# Patient Record
Sex: Female | Born: 1945 | Race: White | Hispanic: No | Marital: Married | Smoking: Never smoker
Health system: Southern US, Community
[De-identification: ages and names within clinical notes are randomized; demographics above are authoritative.]

## PROBLEM LIST (undated history)

## (undated) DIAGNOSIS — Z9221 Personal history of antineoplastic chemotherapy: Secondary | ICD-10-CM

## (undated) DIAGNOSIS — C801 Malignant (primary) neoplasm, unspecified: Secondary | ICD-10-CM

## (undated) DIAGNOSIS — N189 Chronic kidney disease, unspecified: Secondary | ICD-10-CM

## (undated) DIAGNOSIS — C50919 Malignant neoplasm of unspecified site of unspecified female breast: Secondary | ICD-10-CM

## (undated) DIAGNOSIS — Z85528 Personal history of other malignant neoplasm of kidney: Secondary | ICD-10-CM

## (undated) DIAGNOSIS — J45909 Unspecified asthma, uncomplicated: Secondary | ICD-10-CM

## (undated) DIAGNOSIS — Z923 Personal history of irradiation: Secondary | ICD-10-CM

## (undated) HISTORY — DX: Chronic kidney disease, unspecified: N18.9

## (undated) HISTORY — PX: BREAST BIOPSY: SHX20

## (undated) HISTORY — DX: Personal history of other malignant neoplasm of kidney: Z85.528

## (undated) HISTORY — PX: BREAST EXCISIONAL BIOPSY: SUR124

## (undated) HISTORY — PX: KIDNEY SURGERY: SHX687

## (undated) HISTORY — DX: Unspecified asthma, uncomplicated: J45.909

---

## 1992-02-23 DIAGNOSIS — Z9221 Personal history of antineoplastic chemotherapy: Secondary | ICD-10-CM

## 1992-02-23 DIAGNOSIS — Z923 Personal history of irradiation: Secondary | ICD-10-CM

## 1992-02-23 HISTORY — DX: Personal history of antineoplastic chemotherapy: Z92.21

## 1992-02-23 HISTORY — PX: BREAST LUMPECTOMY: SHX2

## 1992-02-23 HISTORY — DX: Personal history of irradiation: Z92.3

## 1992-02-23 HISTORY — PX: BREAST BIOPSY: SHX20

## 1996-02-23 HISTORY — PX: BREAST BIOPSY: SHX20

## 1996-02-23 HISTORY — PX: BREAST LUMPECTOMY: SHX2

## 2007-04-03 DIAGNOSIS — J45909 Unspecified asthma, uncomplicated: Secondary | ICD-10-CM | POA: Insufficient documentation

## 2009-05-20 DIAGNOSIS — J309 Allergic rhinitis, unspecified: Secondary | ICD-10-CM | POA: Insufficient documentation

## 2009-08-19 ENCOUNTER — Ambulatory Visit: Payer: Self-pay | Admitting: Internal Medicine

## 2011-02-11 ENCOUNTER — Ambulatory Visit: Payer: Self-pay | Admitting: Internal Medicine

## 2011-10-20 ENCOUNTER — Ambulatory Visit: Payer: Self-pay

## 2012-02-14 ENCOUNTER — Ambulatory Visit: Payer: Self-pay | Admitting: Internal Medicine

## 2013-02-19 ENCOUNTER — Ambulatory Visit: Payer: Self-pay | Admitting: Internal Medicine

## 2013-05-08 DIAGNOSIS — Z961 Presence of intraocular lens: Secondary | ICD-10-CM | POA: Insufficient documentation

## 2013-05-08 DIAGNOSIS — H0230 Blepharochalasis unspecified eye, unspecified eyelid: Secondary | ICD-10-CM | POA: Insufficient documentation

## 2013-05-08 DIAGNOSIS — H40009 Preglaucoma, unspecified, unspecified eye: Secondary | ICD-10-CM | POA: Insufficient documentation

## 2013-07-12 DIAGNOSIS — Z905 Acquired absence of kidney: Secondary | ICD-10-CM | POA: Insufficient documentation

## 2014-02-25 ENCOUNTER — Ambulatory Visit: Payer: Self-pay | Admitting: Internal Medicine

## 2015-05-01 DIAGNOSIS — Z85528 Personal history of other malignant neoplasm of kidney: Secondary | ICD-10-CM | POA: Insufficient documentation

## 2015-05-02 ENCOUNTER — Other Ambulatory Visit: Payer: Self-pay | Admitting: Internal Medicine

## 2015-05-02 DIAGNOSIS — Z1231 Encounter for screening mammogram for malignant neoplasm of breast: Secondary | ICD-10-CM

## 2015-05-12 DIAGNOSIS — Z85528 Personal history of other malignant neoplasm of kidney: Secondary | ICD-10-CM | POA: Insufficient documentation

## 2015-05-13 ENCOUNTER — Ambulatory Visit: Payer: Self-pay

## 2015-05-16 ENCOUNTER — Other Ambulatory Visit: Payer: Self-pay | Admitting: Internal Medicine

## 2015-05-16 ENCOUNTER — Ambulatory Visit
Admission: RE | Admit: 2015-05-16 | Discharge: 2015-05-16 | Disposition: A | Discharge status: Home / Self Care | Payer: Medicare Other | Source: Ambulatory Visit | Attending: Internal Medicine | Admitting: Internal Medicine

## 2015-05-16 DIAGNOSIS — Z1231 Encounter for screening mammogram for malignant neoplasm of breast: Secondary | ICD-10-CM | POA: Diagnosis present

## 2015-05-16 HISTORY — DX: Malignant neoplasm of unspecified site of unspecified female breast: C50.919

## 2015-05-16 HISTORY — DX: Malignant (primary) neoplasm, unspecified: C80.1

## 2015-11-10 DIAGNOSIS — N1831 Chronic kidney disease, stage 3a: Secondary | ICD-10-CM | POA: Insufficient documentation

## 2016-05-10 ENCOUNTER — Other Ambulatory Visit: Payer: Self-pay | Admitting: Internal Medicine

## 2016-05-10 DIAGNOSIS — Z1231 Encounter for screening mammogram for malignant neoplasm of breast: Secondary | ICD-10-CM

## 2016-05-10 DIAGNOSIS — I700 Atherosclerosis of aorta: Secondary | ICD-10-CM | POA: Insufficient documentation

## 2016-06-08 ENCOUNTER — Ambulatory Visit
Admission: RE | Admit: 2016-06-08 | Discharge: 2016-06-08 | Disposition: A | Discharge status: Home / Self Care | Payer: Medicare Other | Source: Ambulatory Visit | Attending: Internal Medicine | Admitting: Internal Medicine

## 2016-06-08 DIAGNOSIS — Z1231 Encounter for screening mammogram for malignant neoplasm of breast: Secondary | ICD-10-CM | POA: Insufficient documentation

## 2017-05-24 ENCOUNTER — Other Ambulatory Visit: Payer: Self-pay | Admitting: Internal Medicine

## 2017-05-24 DIAGNOSIS — Z1231 Encounter for screening mammogram for malignant neoplasm of breast: Secondary | ICD-10-CM

## 2017-06-21 ENCOUNTER — Ambulatory Visit
Admission: RE | Admit: 2017-06-21 | Discharge: 2017-06-21 | Disposition: A | Discharge status: Home / Self Care | Payer: Medicare Other | Source: Ambulatory Visit | Attending: Internal Medicine | Admitting: Internal Medicine

## 2017-06-21 DIAGNOSIS — Z1231 Encounter for screening mammogram for malignant neoplasm of breast: Secondary | ICD-10-CM | POA: Diagnosis present

## 2017-06-21 HISTORY — DX: Personal history of antineoplastic chemotherapy: Z92.21

## 2017-06-21 HISTORY — DX: Personal history of irradiation: Z92.3

## 2017-11-27 DIAGNOSIS — M8589 Other specified disorders of bone density and structure, multiple sites: Secondary | ICD-10-CM | POA: Insufficient documentation

## 2017-12-07 DIAGNOSIS — I071 Rheumatic tricuspid insufficiency: Secondary | ICD-10-CM | POA: Insufficient documentation

## 2018-08-01 ENCOUNTER — Other Ambulatory Visit: Payer: Self-pay | Admitting: Internal Medicine

## 2018-08-01 DIAGNOSIS — Z1231 Encounter for screening mammogram for malignant neoplasm of breast: Secondary | ICD-10-CM

## 2018-08-11 ENCOUNTER — Ambulatory Visit
Admission: RE | Admit: 2018-08-11 | Discharge: 2018-08-11 | Disposition: A | Discharge status: Home / Self Care | Payer: Medicare Other | Source: Ambulatory Visit | Attending: Internal Medicine | Admitting: Internal Medicine

## 2018-08-11 ENCOUNTER — Other Ambulatory Visit: Payer: Self-pay

## 2018-08-11 DIAGNOSIS — Z1231 Encounter for screening mammogram for malignant neoplasm of breast: Secondary | ICD-10-CM | POA: Diagnosis not present

## 2019-07-17 ENCOUNTER — Other Ambulatory Visit: Payer: Self-pay | Admitting: Internal Medicine

## 2019-07-17 DIAGNOSIS — Z1231 Encounter for screening mammogram for malignant neoplasm of breast: Secondary | ICD-10-CM

## 2019-08-30 ENCOUNTER — Ambulatory Visit
Admission: RE | Admit: 2019-08-30 | Discharge: 2019-08-30 | Disposition: A | Discharge status: Home / Self Care | Payer: Medicare PPO | Source: Ambulatory Visit | Attending: Internal Medicine | Admitting: Internal Medicine

## 2019-08-30 DIAGNOSIS — Z1231 Encounter for screening mammogram for malignant neoplasm of breast: Secondary | ICD-10-CM | POA: Insufficient documentation

## 2020-01-30 ENCOUNTER — Other Ambulatory Visit: Payer: Self-pay

## 2020-01-30 ENCOUNTER — Ambulatory Visit
Admission: RE | Admit: 2020-01-30 | Discharge: 2020-01-30 | Disposition: A | Discharge status: Home / Self Care | Payer: Medicare PPO | Source: Ambulatory Visit | Attending: Pulmonary Disease | Admitting: Pulmonary Disease

## 2020-01-30 ENCOUNTER — Encounter: Payer: Self-pay | Admitting: Pulmonary Disease

## 2020-01-30 ENCOUNTER — Ambulatory Visit: Payer: Medicare PPO | Admitting: Pulmonary Disease

## 2020-01-30 VITALS — BP 126/72 | HR 87 | Temp 98.0°F | Ht 64.0 in | Wt 133.0 lb

## 2020-01-30 DIAGNOSIS — K219 Gastro-esophageal reflux disease without esophagitis: Secondary | ICD-10-CM

## 2020-01-30 DIAGNOSIS — R053 Chronic cough: Secondary | ICD-10-CM

## 2020-01-30 DIAGNOSIS — K8020 Calculus of gallbladder without cholecystitis without obstruction: Secondary | ICD-10-CM | POA: Insufficient documentation

## 2020-01-30 DIAGNOSIS — R0982 Postnasal drip: Secondary | ICD-10-CM

## 2020-01-30 DIAGNOSIS — E785 Hyperlipidemia, unspecified: Secondary | ICD-10-CM | POA: Insufficient documentation

## 2020-01-30 DIAGNOSIS — D1800 Hemangioma unspecified site: Secondary | ICD-10-CM | POA: Insufficient documentation

## 2020-01-30 MED ORDER — CETIRIZINE HCL 10 MG PO TABS: 5.0000 mg | ORAL_TABLET | Freq: Every day | ORAL | 0 refills | Status: AC

## 2020-01-30 NOTE — Progress Notes (Signed)
Subjective:    Patient ID: Alexandria Carter, female    DOB: 1945/10/25, 74 y.o.   MRN: [id number]  HPI Patient is a 74 year old lifelong never smoker who presents for evaluation of a cough of 13 years duration.  She is kindly referred by Dr. Ramonita Lab III.  As noted the patient has been lifelong never smoker however did get exposed significantly to secondhand smoke as a child.  States that she was diagnosed with breast cancer in 1994 and at that time underwent radiation therapy.  She noted cough after the radiation therapy and steroids at that time helped.  Her cough solved at that point but then subsequently around 2008 she developed problems with cough particularly in the mornings.  She notes postnasal drip and occasional nasal congestion but is afraid of using antihistamines due to potential risk for kidney dysfunction.  She has a history of renal cell cancer and has a solitary kidney.  She has been diagnosed with asthma in 2005 however uses no inhalers.  She has been diagnosed with GERD and is on omeprazole which she only takes 3 times a week.  She states however her symptoms are well controlled.  Previously her work-up of cough included bronchoscopy in 2010 by Dr. Shanon Brow DeFeo in [place].  That bronchoscopy only showed some mild erythema of the airways but no structural lesions.  Cultures were negative.  I have reviewed those records personally, available through [facility].  She is not on ACE inhibitors.  She has not noticed any fevers, chills or sweats.  Her cough is usually worse in the mornings when her postnasal drip is worse just immediately upon arising.  She has had no sputum production no hemoptysis.  She has not had any orthopnea, paroxysmal nocturnal dyspnea or lower extremity edema.  Her cough is only alleviated partly by drinking water or by using cough drops.   Review of Systems A 10 point review of systems was performed and it is as noted above otherwise negative.  Past  Medical History:  Diagnosis Date  . Asthma   . Breast cancer ([place]) 1994 and 1998   Bilateral  . Cancer ([place]) 9735,3299   Bil breast ca- chemo/radiation  . Cancer ([place])    Kidney  . Chronic kidney disease   . History of kidney cancer   . Personal history of chemotherapy 1994   F/U left breast cancer  . Personal history of radiation therapy 1994   F/U left breast cancer  . Personal history of radiation therapy 1998   F/U right breast cancer   Patient Active Problem List   Diagnosis Date Noted  . Gallstones 01/30/2020  . GERD (gastroesophageal reflux disease) 01/30/2020  . Hemangioma 01/30/2020  . Hyperlipidemia 01/30/2020  . Moderate tricuspid regurgitation 12/07/2017  . Osteopenia of multiple sites 11/27/2017  . Aortic atherosclerosis ([place]) 05/10/2016  . History of kidney cancer 05/12/2015  . H/O renal cell carcinoma 05/01/2015  . Solitary kidney, acquired 07/12/2013  . Glaucoma suspect 05/08/2013  . Pseudophakia of both eyes 05/08/2013  . Pseudoptosis 05/08/2013  . Allergic rhinitis 05/20/2009  . Asthma 04/03/2007   Past Surgical History:  Procedure Laterality Date  . BREAST BIOPSY Left 1994   +  . BREAST BIOPSY Right 1998   +  . BREAST BIOPSY Bilateral    neg  . BREAST LUMPECTOMY Left 1994   positive, F/U chemo and radiation  . BREAST LUMPECTOMY Right 1998   positive, F/U radiation   . KIDNEY SURGERY  Family History  Problem Relation Age of Onset  . Breast cancer Cousin   . Lung cancer Father   . Heart failure Father   . Lung cancer Sister    No Known Allergies  Current Meds  Medication Sig  . ascorbic acid (VITAMIN C) 500 MG tablet Take 500 mg by mouth daily.  Marland Kitchen aspirin 81 MG EC tablet Take 81 mg by mouth daily.  . Calcium Carbonate-Vitamin D (CALCIUM-VITAMIN D) 600-125 MG-UNIT TABS Take 1 tablet by mouth daily.  Marland Kitchen latanoprost (XALATAN) 0.005 % ophthalmic solution nightly  . lovastatin (MEVACOR) 40 MG tablet 80 mg daily.  Marland Kitchen omeprazole (PRILOSEC)  40 MG capsule Take 1 capsule by mouth daily.   Immunization History  Administered Date(s) Administered  . Fluad Quad(high Dose 65+) 11/02/2018, 11/19/2019  . Influenza Inj Mdck Quad Pf 11/16/2017  . Influenza, High Dose Seasonal PF 12/14/2016  . Influenza-Unspecified 11/23/2011, 12/25/2014, 11/10/2015  . Moderna SARS-COVID-2 Vaccination 03/23/2019, 04/20/2019  . Pneumococcal Conjugate-13 03/21/2015  . Pneumococcal Polysaccharide-23 10/08/2010  . Tdap 02/22/2005  . Zoster 04/22/2008  . Zoster Recombinat (Shingrix) 09/20/2018, 11/27/2018        Objective:   Physical Exam BP 126/72 (BP Location: Left Arm, Cuff Size: Normal)   Pulse 87   Temp 98 F (36.7 C) (Temporal)   Ht 5\' 4"  (1.626 m)   Wt 133 lb (60.3 kg)   SpO2 97%   BMI 22.83 kg/m  GENERAL: Well-developed well-nourished woman, no acute distress.  Slender build.  Fully ambulatory.  Does clear her throat frequently during the visit. HEAD: Normocephalic, atraumatic.  EYES: Pupils equal, round, reactive to light.  No scleral icterus.  MOUTH: Nose/mouth/throat not examined due to masking requirements for COVID 19. NECK: Supple. No thyromegaly. Trachea midline. No JVD.  No adenopathy. PULMONARY: Good air entry bilaterally.  No adventitious sounds. CARDIOVASCULAR: S1 and S2. Regular rate and rhythm.  No rubs, murmurs or gallops heard. ABDOMEN: Benign. MUSCULOSKELETAL: No joint deformity, no clubbing, no edema.  NEUROLOGIC: No focal deficits, no gait disturbance, speech is fluent. SKIN: Intact,warm,dry.  On limited exam, no rashes. PSYCH: Mood and behavior are normal.   Chest x-ray performed today, independently reviewed:      There appears to be apical pleural-parenchymal apical scarring on the left with mild hyperinflation, pleural-parenchymal scarring has been described on prior x-ray reports.     Assessment & Plan:     ICD-10-CM   1. Chronic cough  R05.3 Pulmonary Function Test [facility] Only    DG Chest 2 View    Suspect mostly of upper airway cough syndrome May have element of GERD/LPR Will obtain PFTs as does have history of asthma  2. Post-nasal drip  R09.82    Trial Zyrtec 5 mg at bedtime Nasal hygiene  3. Gastroesophageal reflux disease, unspecified whether esophagitis present  K21.9    Continue omeprazole Continue antireflux measures   Orders Placed This Encounter  Procedures  . DG Chest 2 View    Standing Status:   Future    Number of Occurrences:   1    Standing Expiration Date:   07/30/2020    Order Specific Question:   Reason for Exam (SYMPTOM  OR DIAGNOSIS REQUIRED)    Answer:   cough    Order Specific Question:   Preferred imaging location?    Answer:   [facility]  . Pulmonary Function Test [facility] Only    Standing Status:   Future    Standing Expiration Date:   01/29/2021  Scheduling Instructions:     Next available.    Order Specific Question:   Full PFT: includes the following: basic spirometry, spirometry pre & post bronchodilator, diffusion capacity (DLCO), lung volumes    Answer:   Full PFT   Meds ordered this encounter  Medications  . cetirizine (ZYRTEC ALLERGY) 10 MG tablet    Sig: Take 0.5 tablets (5 mg total) by mouth at bedtime.    Dispense:  30 tablet    Refill:  0   Discussion:  The patient has had history of coughing of longstanding.  Control the cough may be difficult as by this point there may be some degree of habituation.  It does appear however that she has significant postnasal drip issues which could be leading to upper airway cough syndrome.  In addition she does have issues with reflux which could lead to laryngopharyngeal reflux.  She was instructed to continue strict antireflux measures and take omeprazole as she is doing.  We will try Zyrtec 5 mg at bedtime to see if this helps with her nasal symptoms.  PFTs are indicated as the patient has had a history of asthma previously but currently on no inhalers.  We will determine whether she needs CT  chest after her PFTs if these show any diffusion capacity issues.  X-ray today was unremarkable compared to prior reports as we have no actual films.  We will see the patient in follow-up in 4 to 6 weeks time she is to contact us prior to that time should a new difficulties arise.   Renold Don, MD [place] [facility]   *This note was dictated using voice recognition software/Dragon.  Despite best efforts to proofread, errors can occur which can change the meaning.  Any change was purely unintentional.

## 2020-01-30 NOTE — Patient Instructions (Signed)
We are going to get a chest x-ray to initiate evaluation of your cough.  We will obtain breathing test to exclude potential asthma.  Start taking Zyrtec (cetirizine) over-the-counter, this is a 10 mg tablet, half tablet (5 mg) at bedtime.  We will see you in follow-up in 4 to 6 weeks time call sooner should any new problems arise.

## 2020-01-31 ENCOUNTER — Other Ambulatory Visit: Payer: Self-pay

## 2020-01-31 DIAGNOSIS — R059 Cough, unspecified: Secondary | ICD-10-CM

## 2020-02-01 ENCOUNTER — Other Ambulatory Visit
Admission: RE | Admit: 2020-02-01 | Discharge: 2020-02-01 | Disposition: A | Discharge status: Home / Self Care | Payer: Medicare PPO | Source: Ambulatory Visit | Attending: Pulmonary Disease | Admitting: Pulmonary Disease

## 2020-02-01 ENCOUNTER — Other Ambulatory Visit: Payer: Self-pay

## 2020-02-01 DIAGNOSIS — Z20822 Contact with and (suspected) exposure to covid-19: Secondary | ICD-10-CM | POA: Diagnosis not present

## 2020-02-01 DIAGNOSIS — Z01812 Encounter for preprocedural laboratory examination: Secondary | ICD-10-CM | POA: Insufficient documentation

## 2020-02-02 LAB — SARS CORONAVIRUS 2 (TAT 6-24 HRS): SARS Coronavirus 2: NEGATIVE

## 2020-02-04 ENCOUNTER — Other Ambulatory Visit: Payer: Self-pay

## 2020-02-04 ENCOUNTER — Ambulatory Visit: Payer: Medicare PPO | Attending: Pulmonary Disease

## 2020-02-04 DIAGNOSIS — R053 Chronic cough: Secondary | ICD-10-CM | POA: Diagnosis present

## 2020-02-04 MED ORDER — ALBUTEROL SULFATE (2.5 MG/3ML) 0.083% IN NEBU
2.5000 mg | INHALATION_SOLUTION | Freq: Once | RESPIRATORY_TRACT | Status: AC
  Administered 2020-02-04: 11:00:00 2.5 mg via RESPIRATORY_TRACT
  Filled 2020-02-04: qty 3

## 2020-02-05 LAB — PULMONARY FUNCTION TEST ARMC ONLY
DL/VA % pred: 84 %
DL/VA: 3.46 ml/min/mmHg/L
DLCO unc % pred: 77 %
DLCO unc: 14.82 ml/min/mmHg
FEF 25-75 Post: 1.07 L/sec
FEF 25-75 Pre: 0.69 L/sec
FEF2575-%Change-Post: 54 %
FEF2575-%Pred-Post: 64 %
FEF2575-%Pred-Pre: 41 %
FEV1-%Change-Post: 12 %
FEV1-%Pred-Post: 77 %
FEV1-%Pred-Pre: 69 %
FEV1-Post: 1.64 L
FEV1-Pre: 1.46 L
FEV1FVC-%Change-Post: 7 %
FEV1FVC-%Pred-Pre: 82 %
FEV6-%Change-Post: 5 %
FEV6-%Pred-Post: 91 %
FEV6-%Pred-Pre: 86 %
FEV6-Post: 2.44 L
FEV6-Pre: 2.32 L
FEV6FVC-%Change-Post: 1 %
FEV6FVC-%Pred-Post: 104 %
FEV6FVC-%Pred-Pre: 103 %
FVC-%Change-Post: 4 %
FVC-%Pred-Post: 87 %
FVC-%Pred-Pre: 83 %
FVC-Post: 2.46 L
Post FEV1/FVC ratio: 67 %
Post FEV6/FVC ratio: 99 %
Pre FEV1/FVC ratio: 62 %
Pre FEV6/FVC Ratio: 98 %
RV % pred: 113 %
RV: 2.61 L
TLC % pred: 97 %
TLC: 4.95 L

## 2020-02-06 ENCOUNTER — Other Ambulatory Visit: Payer: Self-pay

## 2020-02-06 MED ORDER — BREO ELLIPTA 200-25 MCG/INH IN AEPB: 1.0000 | INHALATION_SPRAY | Freq: Every day | RESPIRATORY_TRACT | 0 refills | Status: AC

## 2020-02-18 NOTE — Telephone Encounter (Signed)
Dr. Jayme Cloud please advise on patient mychart message  I am using the sample Breo inhaler.  There are only 6 more uses for it.  I need a prescription if you want me to continue using Breo. Thank you.

## 2020-02-19 ENCOUNTER — Other Ambulatory Visit: Payer: Self-pay

## 2020-02-19 ENCOUNTER — Ambulatory Visit
Admission: RE | Admit: 2020-02-19 | Discharge: 2020-02-19 | Disposition: A | Discharge status: Home / Self Care | Payer: Medicare PPO | Source: Ambulatory Visit | Attending: Pulmonary Disease | Admitting: Pulmonary Disease

## 2020-02-19 DIAGNOSIS — R059 Cough, unspecified: Secondary | ICD-10-CM | POA: Insufficient documentation

## 2020-02-19 DIAGNOSIS — R918 Other nonspecific abnormal finding of lung field: Secondary | ICD-10-CM | POA: Diagnosis not present

## 2020-02-19 DIAGNOSIS — E041 Nontoxic single thyroid nodule: Secondary | ICD-10-CM | POA: Diagnosis not present

## 2020-02-19 DIAGNOSIS — I2510 Atherosclerotic heart disease of native coronary artery without angina pectoris: Secondary | ICD-10-CM | POA: Insufficient documentation

## 2020-02-19 DIAGNOSIS — J984 Other disorders of lung: Secondary | ICD-10-CM | POA: Diagnosis not present

## 2020-02-19 DIAGNOSIS — I700 Atherosclerosis of aorta: Secondary | ICD-10-CM | POA: Diagnosis not present

## 2020-02-19 MED ORDER — BREO ELLIPTA 200-25 MCG/INH IN AEPB: 1.0000 | INHALATION_SPRAY | Freq: Every day | RESPIRATORY_TRACT | 6 refills | Status: DC

## 2020-02-27 ENCOUNTER — Other Ambulatory Visit: Payer: Self-pay

## 2020-02-27 DIAGNOSIS — R918 Other nonspecific abnormal finding of lung field: Secondary | ICD-10-CM

## 2020-03-03 ENCOUNTER — Other Ambulatory Visit: Payer: Self-pay

## 2020-03-03 ENCOUNTER — Ambulatory Visit: Payer: Medicare PPO | Admitting: Pulmonary Disease

## 2020-03-03 ENCOUNTER — Encounter: Payer: Self-pay | Admitting: Pulmonary Disease

## 2020-03-03 VITALS — BP 140/82 | HR 100 | Temp 97.1°F | Ht 64.0 in | Wt 132.6 lb

## 2020-03-03 DIAGNOSIS — J4540 Moderate persistent asthma, uncomplicated: Secondary | ICD-10-CM

## 2020-03-03 DIAGNOSIS — R053 Chronic cough: Secondary | ICD-10-CM | POA: Diagnosis not present

## 2020-03-03 DIAGNOSIS — R0982 Postnasal drip: Secondary | ICD-10-CM | POA: Diagnosis not present

## 2020-03-03 DIAGNOSIS — K219 Gastro-esophageal reflux disease without esophagitis: Secondary | ICD-10-CM | POA: Diagnosis not present

## 2020-03-03 MED ORDER — MONTELUKAST SODIUM 10 MG PO TABS
10.0000 mg | ORAL_TABLET | Freq: Every day | ORAL | 2 refills | Status: DC
Start: 2020-03-03 — End: 2020-05-26

## 2020-03-03 MED ORDER — TRIAMCINOLONE ACETONIDE 55 MCG/ACT NA AERO: 1.0000 | INHALATION_SPRAY | Freq: Two times a day (BID) | NASAL | 11 refills | Status: AC

## 2020-03-03 NOTE — Progress Notes (Deleted)
   Subjective:    Patient ID: Alexandria Carter, female    DOB: [birth year], 75 y.o.   MRN: [id number]  HPI    Review of Systems     Objective:   Physical Exam        Assessment & Plan:

## 2020-03-03 NOTE — Patient Instructions (Signed)
We are giving you a trial of Singulair 1 tablet daily  Nasacort 1 spray twice a day to each nostril  Continue Breo Ellipta 1 inhalation daily actually rinse her mouth well after use it.  We will see her in follow-up in 6 to 8 weeks time call sooner should any difficulties arise.

## 2020-03-03 NOTE — Progress Notes (Signed)
Subjective:    Patient ID: Alexandria Carter, female    DOB: Nov 11, 1945, 75 y.o.   MRN: [id number]  HPI  Patient is a 75 year old lifelong never smoker who follows here for the issue of a cough of over 13 years duration.  She did have exposure to secondhand smoke as a child.  She was diagnosed with breast cancer in 1994 and at that time underwent radiation therapy.  Noted cough after the radiation therapy and steroids at that time helped.  She had a respite period .  Cough is worse in the mornings.  We evaluated her here initially on 30 January 2020.  Please refer to that note for details.  During that visit we advised her to take cetirizine at bedtime and ordered pulmonary function testing.  She is on omeprazole for GERD.  States her symptoms are controlled with omeprazole.  PFTs were performed on 13 December these are compatible with asthma she does have other obstruction with a reversible airway component.  Her FEV1 was 1.46 L or 69% predicted FEV1/FVC was 62%.  She did have a 12% net change in FEV1 after bronchodilator.  Likewise small airways as noted on FEF 25-75% had significant reversibility of obstruction.  Her diffusion capacity was normal at 83%.  There was evidence of air trapping and hyperinflation.  She was started on Breo Ellipta 200/25, 1 inhalation daily, she has been compliant with this, noted some improvement in his of breath but not on cough.  Voices no other complaint.  Review of Systems A 10 point review of systems was performed and it is as noted above otherwise negative.  Patient Active Problem List   Diagnosis Date Noted  . Gallstones 01/30/2020  . GERD (gastroesophageal reflux disease) 01/30/2020  . Hemangioma 01/30/2020  . Hyperlipidemia 01/30/2020  . Moderate tricuspid regurgitation 12/07/2017  . Osteopenia of multiple sites 11/27/2017  . Aortic atherosclerosis ([place]) 05/10/2016  . History of kidney cancer 05/12/2015  . H/O renal cell carcinoma 05/01/2015  .  Solitary kidney, acquired 07/12/2013  . Glaucoma suspect 05/08/2013  . Pseudophakia of both eyes 05/08/2013  . Pseudoptosis 05/08/2013  . Allergic rhinitis 05/20/2009  . Asthma 04/03/2007   No Known Allergies Current Meds  Medication Sig  . ascorbic acid (VITAMIN C) 500 MG tablet Take 500 mg by mouth daily.  Marland Kitchen aspirin 81 MG EC tablet Take 81 mg by mouth daily.  . Calcium Carbonate-Vitamin D (CALCIUM-VITAMIN D) 600-125 MG-UNIT TABS Take 1 tablet by mouth daily.  . fluticasone furoate-vilanterol (BREO ELLIPTA) 200-25 MCG/INH AEPB Inhale 1 puff into the lungs daily.  Marland Kitchen latanoprost (XALATAN) 0.005 % ophthalmic solution nightly  . lovastatin (MEVACOR) 40 MG tablet 80 mg daily.  . montelukast (SINGULAIR) 10 MG tablet Take 1 tablet (10 mg total) by mouth daily.  Marland Kitchen omeprazole (PRILOSEC) 40 MG capsule Take 1 capsule by mouth daily.  Marland Kitchen triamcinolone (NASACORT) 55 MCG/ACT AERO nasal inhaler Place 1 spray into the nose in the morning and at bedtime.   Immunization History  Administered Date(s) Administered  . Fluad Quad(high Dose 65+) 11/02/2018, 11/19/2019  . Influenza Inj Mdck Quad Pf 11/16/2017  . Influenza, High Dose Seasonal PF 12/14/2016  . Influenza-Unspecified 11/23/2011, 12/25/2014, 11/10/2015  . Moderna Sars-Covid-2 Vaccination 03/23/2019, 04/20/2019, 12/21/2019  . Pneumococcal Conjugate-13 03/21/2015  . Pneumococcal Polysaccharide-23 10/08/2010  . Tdap 02/22/2005  . Zoster 04/22/2008  . Zoster Recombinat (Shingrix) 09/20/2018, 11/27/2018      Objective:   Physical Exam BP 140/82 (BP Location: Left Arm,  Cuff Size: Normal)   Pulse 100   Temp (!) 97.1 F (36.2 C) (Temporal)   Ht 5\' 4"  (1.626 m)   Wt 132 lb 9.6 oz (60.1 kg)   SpO2 98%   BMI 22.76 kg/m  GENERAL: Well-developed well-nourished woman, no acute distress.  Slender build.  Fully ambulatory.  Does clear her throat frequently during the visit. HEAD: Normocephalic, atraumatic.  EYES: Pupils equal, round, reactive to  light.  No scleral icterus.  MOUTH: Nose/mouth/throat not examined due to masking requirements for COVID 19. NECK: Supple. No thyromegaly. Trachea midline. No JVD.  No adenopathy. PULMONARY: Good air entry bilaterally.  No adventitious sounds. CARDIOVASCULAR: S1 and S2. Regular rate and rhythm.  No rubs, murmurs or gallops heard. ABDOMEN: Benign. MUSCULOSKELETAL: No joint deformity, no clubbing, no edema.  NEUROLOGIC: No focal deficits, no gait disturbance, speech is fluent. SKIN: Intact,warm,dry.  On limited exam, no rashes. PSYCH: Mood and behavior are normal.     Assessment & Plan:     ICD-10-CM   1. Chronic cough  R05.3    This will be difficult to control due to the duration Add Singulair as below Follow-up 6 to 8 weeks  2. Moderate persistent asthma without complication  Z76.73    Continue Breo Ellipta 200/25 1 puff daily Singular added, 1 daily  3. Post-nasal drip  R09.82    Singulair 10 mg daily Nasacort 1 spray to each nostril twice a day  4. Gastroesophageal reflux disease, unspecified whether esophagitis present  K21.9    Continue omeprazole Continue antireflux measures   Meds ordered this encounter  Medications  . montelukast (SINGULAIR) 10 MG tablet    Sig: Take 1 tablet (10 mg total) by mouth daily.    Dispense:  30 tablet    Refill:  2  . triamcinolone (NASACORT) 55 MCG/ACT AERO nasal inhaler    Sig: Place 1 spray into the nose in the morning and at bedtime.    Dispense:  10.8 mL    Refill:  11   Discussion:  As noted previously, the patient's cough has been of longstanding (over 13 years) there is a degree of habituation that will be difficult to control with management of the above identified problems.  We will see the patient in follow-up in 6 to 8 weeks time she is to contact us prior to that time should any new difficulties arise.  Renold Don, MD [place] [facility]   *This note was dictated using voice recognition software/Dragon.  Despite best  efforts to proofread, errors can occur which can change the meaning.  Any change was purely unintentional.

## 2020-04-16 ENCOUNTER — Other Ambulatory Visit
Admission: RE | Admit: 2020-04-16 | Discharge: 2020-04-16 | Disposition: A | Discharge status: Home / Self Care | Payer: Medicare PPO | Source: Ambulatory Visit | Attending: Pulmonary Disease | Admitting: Pulmonary Disease

## 2020-04-16 ENCOUNTER — Encounter: Payer: Self-pay | Admitting: Pulmonary Disease

## 2020-04-16 ENCOUNTER — Ambulatory Visit: Payer: Medicare PPO | Admitting: Pulmonary Disease

## 2020-04-16 ENCOUNTER — Other Ambulatory Visit: Payer: Self-pay

## 2020-04-16 VITALS — BP 136/78 | HR 78 | Temp 97.8°F | Ht 64.0 in | Wt 133.8 lb

## 2020-04-16 DIAGNOSIS — R918 Other nonspecific abnormal finding of lung field: Secondary | ICD-10-CM

## 2020-04-16 DIAGNOSIS — R053 Chronic cough: Secondary | ICD-10-CM

## 2020-04-16 DIAGNOSIS — J4540 Moderate persistent asthma, uncomplicated: Secondary | ICD-10-CM | POA: Diagnosis not present

## 2020-04-16 DIAGNOSIS — K219 Gastro-esophageal reflux disease without esophagitis: Secondary | ICD-10-CM

## 2020-04-16 DIAGNOSIS — R0982 Postnasal drip: Secondary | ICD-10-CM

## 2020-04-16 LAB — CBC WITH DIFFERENTIAL/PLATELET
Abs Immature Granulocytes: 0.01 10*3/uL (ref 0.00–0.07)
Basophils Absolute: 0.1 10*3/uL (ref 0.0–0.1)
Basophils Relative: 1 %
Eosinophils Absolute: 0.1 10*3/uL (ref 0.0–0.5)
Eosinophils Relative: 2 %
HCT: 40.9 % (ref 36.0–46.0)
Hemoglobin: 12.8 g/dL (ref 12.0–15.0)
Immature Granulocytes: 0 %
Lymphocytes Relative: 14 %
Lymphs Abs: 0.9 10*3/uL (ref 0.7–4.0)
MCH: 28.6 pg (ref 26.0–34.0)
MCHC: 31.3 g/dL (ref 30.0–36.0)
MCV: 91.3 fL (ref 80.0–100.0)
Monocytes Absolute: 0.5 10*3/uL (ref 0.1–1.0)
Monocytes Relative: 8 %
Neutro Abs: 5 10*3/uL (ref 1.7–7.7)
Neutrophils Relative %: 75 %
Platelets: 253 10*3/uL (ref 150–400)
RBC: 4.48 MIL/uL (ref 3.87–5.11)
RDW: 13.1 % (ref 11.5–15.5)
WBC: 6.6 10*3/uL (ref 4.0–10.5)
nRBC: 0 % (ref 0.0–0.2)

## 2020-04-16 NOTE — Progress Notes (Signed)
Subjective:    Patient ID: Alexandria Carter, female    DOB: February 08, 1946, 75 y.o.   MRN: [id number] Chief Complaint  Patient presents with  . Follow-up    Prod cough with clear sputum.     HPI Patient is a 75 year old lifelong never smoker who follows here for the issue of cough of 13 years duration.  She has been recently noted to have PFTs consistent with asthma.  She is on Breo Ellipta 200/25, 1 inhalation daily.  She notes some improvement on her cough overall.  She is on omeprazole for gastroesophageal reflux symptoms and feels that these are controlled with omeprazole.  She also follows antireflux measures.  She noted cough after radiation therapy and steroids at that time helped.  She does have issues with postnasal drip that are controlled with Nasacort and's Zyrtec.  She has not had allergy evaluation.  She does not endorse any other symptoms today.  Has had no fevers, chills or sweats.  No sputum production or hemoptysis.  DATA: 02/04/2020 PFTs: FEV1 1.46 L or 69% predicted, FVC 2.36 L or 83% predicted, FEV1/FVC 62%.  Postbronchodilator 12% net change.  Lung volumes consistent with mild air trapping.  Small airways component with airways reversibility noted.  Diffusion capacity normal by K[facility].  Consistent with asthma 02/19/2020 CT chest: No acute cardiopulmonary abnormalities.  Chronic scarring and masslike architectural distortion involving anterior lateral left upper lobe sequela of prior radiation.  Scattered small lung nodules 6 mm or less in diameter repeat CT at 3 to 6 months recommended   Review of Systems A 10 point review of systems was performed and it is as noted above otherwise negative.  Patient Active Problem List   Diagnosis Date Noted  . Gallstones 01/30/2020  . GERD (gastroesophageal reflux disease) 01/30/2020  . Hemangioma 01/30/2020  . Hyperlipidemia 01/30/2020  . Moderate tricuspid regurgitation 12/07/2017  . Osteopenia of multiple sites 11/27/2017  . Aortic  atherosclerosis ([place]) 05/10/2016  . History of kidney cancer 05/12/2015  . H/O renal cell carcinoma 05/01/2015  . Solitary kidney, acquired 07/12/2013  . Glaucoma suspect 05/08/2013  . Pseudophakia of both eyes 05/08/2013  . Pseudoptosis 05/08/2013  . Allergic rhinitis 05/20/2009  . Asthma 04/03/2007    No Known Allergies   Current Meds  Medication Sig  . ascorbic acid (VITAMIN C) 500 MG tablet Take 500 mg by mouth daily.  Marland Kitchen aspirin 81 MG EC tablet Take 81 mg by mouth daily.  . Calcium Carbonate-Vitamin D (CALCIUM-VITAMIN D) 600-125 MG-UNIT TABS Take 1 tablet by mouth daily.  . fluticasone furoate-vilanterol (BREO ELLIPTA) 200-25 MCG/INH AEPB Inhale 1 puff into the lungs daily.  Marland Kitchen latanoprost (XALATAN) 0.005 % ophthalmic solution nightly  . lovastatin (MEVACOR) 40 MG tablet 80 mg daily.  . montelukast (SINGULAIR) 10 MG tablet Take 1 tablet (10 mg total) by mouth daily. (Patient taking differently: Take 10 mg by mouth. 3x weekly)  . omeprazole (PRILOSEC) 40 MG capsule Take 1 capsule by mouth daily.  Marland Kitchen triamcinolone (NASACORT) 55 MCG/ACT AERO nasal inhaler Place 1 spray into the nose in the morning and at bedtime.   Immunization History  Administered Date(s) Administered  . Fluad Quad(high Dose 65+) 11/02/2018, 11/19/2019  . Influenza Inj Mdck Quad Pf 11/16/2017  . Influenza, High Dose Seasonal PF 12/14/2016  . Influenza-Unspecified 11/23/2011, 12/25/2014, 11/10/2015  . Moderna Sars-Covid-2 Vaccination 03/23/2019, 04/20/2019, 12/21/2019  . Pneumococcal Conjugate-13 03/21/2015  . Pneumococcal Polysaccharide-23 10/08/2010  . Tdap 02/22/2005  . Zoster 04/22/2008  .  Zoster Recombinat (Shingrix) 09/20/2018, 11/27/2018        Objective:   Physical Exam BP 136/78 (BP Location: Left Arm, Cuff Size: Normal)   Pulse 78   Temp 97.8 F (36.6 C) (Temporal)   Ht 5\' 4"  (1.626 m)   Wt 133 lb 12.8 oz (60.7 kg)   SpO2 98%   BMI 22.97 kg/m  GENERAL: Well-developed well-nourished woman,  no acute distress. Slender build. Fully ambulatory. Does clear her throat frequently during the visit. HEAD: Normocephalic, atraumatic.  EYES: Pupils equal, round, reactive to light. No scleral icterus.  MOUTH: Nose/mouth/throat not examined due to masking requirements for COVID 19. NECK: Supple. No thyromegaly. Trachea midline. No JVD. No adenopathy. PULMONARY: Good air entry bilaterally. No adventitious sounds. CARDIOVASCULAR: S1 and S2. Regular rate and rhythm. No rubs, murmurs or gallops heard. ABDOMEN: Benign. MUSCULOSKELETAL: No joint deformity, no clubbing, no edema.  NEUROLOGIC: No focal deficits, no gait disturbance, speech is fluent. SKIN: Intact,warm,dry. On limited exam, no rashes. PSYCH: Mood and behavior are normal.     Assessment & Plan:     ICD-10-CM   1. Moderate persistent asthma without complication  X72.62 Allergen Panel (27) + IGE    CBC w/Diff    CANCELED: CBC w/Diff   Continue Breo for now Check allergen panel  2. Chronic cough  R05.3    Somewhat improved with Breo Chronicity will make it difficult to control  3. Post-nasal drip  R09.82    Continue nasal hygiene continue Zyrtec Continue Nasacort Allergen panel being checked  4. Multiple lung nodules on CT  R91.8 CT CHEST WO CONTRAST    CANCELED: CT CHEST LIMITED WO CONTRAST   Repeat CT chest in April 2022  5. Gastroesophageal reflux disease, unspecified whether esophagitis present  K21.9    Continue antireflux measures Continue PPI   Orders Placed This Encounter  Procedures  . CT CHEST WO CONTRAST    53mo    Standing Status:   Future    Number of Occurrences:   1    Standing Expiration Date:   04/16/2021    Order Specific Question:   Preferred imaging location?    Answer:   [facility]  . Allergen Panel (27) + IGE    Standing Status:   Future    Number of Occurrences:   1    Standing Expiration Date:   04/16/2021  . CBC w/Diff    Standing Status:   Future    Number of Occurrences:    1    Standing Expiration Date:   04/16/2021   Sent after CT chest is performed.  She is to contact us prior to that time should any new difficulties arise.  She will be notified of the results of the testing as it becomes available.   Renold Don, MD [facility] [facility]   *This note was dictated using voice recognition software/Dragon.  Despite best efforts to proofread, errors can occur which can change the meaning.  Any change was purely unintentional.

## 2020-04-16 NOTE — Patient Instructions (Signed)
We will repeat CT chest March or April for your nodules that have been followed.  If this is stable you will not need another CT for a year.  Continue using Breo.   We are checking blood work to check for allergies.   We will see you back in March or April after your CT chest is performed.  Call sooner should any new problems arise.

## 2020-04-20 LAB — ALLERGEN PANEL (27) + IGE
Alternaria Alternata IgE: <0.1 kU/L
Aspergillus Fumigatus IgE: <0.1 kU/L
Bahia Grass IgE: <0.1 kU/L
Bermuda Grass IgE: <0.1 kU/L
Cat Dander IgE: <0.1 kU/L
Cedar, Mountain IgE: <0.1 kU/L
Cladosporium Herbarum IgE: <0.1 kU/L
Cocklebur IgE: <0.1 kU/L
Cockroach, American IgE: <0.1 kU/L
Common Silver Birch IgE: <0.1 kU/L
D Farinae IgE: 0.15 kU/L — AB
D Pteronyssinus IgE: 0.15 kU/L — AB
Dog Dander IgE: <0.1 kU/L
Elm, American IgE: <0.1 kU/L
Hickory, White IgE: <0.1 kU/L
IgE (Immunoglobulin E), Serum: 104 IU/mL (ref 6–495)
Johnson Grass IgE: <0.1 kU/L
Kentucky Bluegrass IgE: <0.1 kU/L
Maple/Box Elder IgE: <0.1 kU/L
Mucor Racemosus IgE: <0.1 kU/L
Oak, White IgE: <0.1 kU/L
Penicillium Chrysogen IgE: <0.1 kU/L
Pigweed, Rough IgE: <0.1 kU/L
Plantain, English IgE: <0.1 kU/L
Ragweed, Short IgE: <0.1 kU/L
Setomelanomma Rostrat: <0.1 kU/L
Timothy Grass IgE: <0.1 kU/L
White Mulberry IgE: <0.1 kU/L

## 2020-05-25 ENCOUNTER — Other Ambulatory Visit: Payer: Self-pay | Admitting: Pulmonary Disease

## 2020-05-29 ENCOUNTER — Ambulatory Visit
Admission: RE | Admit: 2020-05-29 | Discharge: 2020-05-29 | Disposition: A | Discharge status: Home / Self Care | Payer: Medicare PPO | Source: Ambulatory Visit | Attending: Pulmonary Disease | Admitting: Pulmonary Disease

## 2020-05-29 ENCOUNTER — Other Ambulatory Visit: Payer: Self-pay

## 2020-05-29 DIAGNOSIS — R918 Other nonspecific abnormal finding of lung field: Secondary | ICD-10-CM | POA: Insufficient documentation

## 2020-06-17 ENCOUNTER — Encounter: Payer: Self-pay | Admitting: Pulmonary Disease

## 2020-07-02 ENCOUNTER — Encounter: Payer: Self-pay | Admitting: Pulmonary Disease

## 2020-07-02 ENCOUNTER — Other Ambulatory Visit: Payer: Self-pay

## 2020-07-02 ENCOUNTER — Ambulatory Visit (INDEPENDENT_AMBULATORY_CARE_PROVIDER_SITE_OTHER): Payer: Medicare PPO | Admitting: Pulmonary Disease

## 2020-07-02 VITALS — BP 134/78 | HR 85 | Temp 97.8°F | Ht 64.0 in | Wt 133.2 lb

## 2020-07-02 DIAGNOSIS — J4540 Moderate persistent asthma, uncomplicated: Secondary | ICD-10-CM | POA: Diagnosis not present

## 2020-07-02 DIAGNOSIS — R918 Other nonspecific abnormal finding of lung field: Secondary | ICD-10-CM | POA: Diagnosis not present

## 2020-07-02 DIAGNOSIS — R053 Chronic cough: Secondary | ICD-10-CM | POA: Diagnosis not present

## 2020-07-02 MED ORDER — TRELEGY ELLIPTA 200-62.5-25 MCG/INH IN AEPB: 1.0000 | INHALATION_SPRAY | Freq: Every day | RESPIRATORY_TRACT | 11 refills | Status: DC

## 2020-07-02 NOTE — Progress Notes (Signed)
Subjective:    Patient ID: Alexandria Carter, female    DOB: 10-30-1945, 75 y.o.   MRN: [id number]  ~~~~~~~~~~~~~~~~~~~~~~~~~~~~~~~~~~~~~~~~~~~~~~~~~~~~~~~~~ Virtual Visit Telephone Note:   This visit type was conducted due to national recommendations for restrictions regarding the COVID-19 pandemic .  This format is felt to be most appropriate for this patient at this time.  All issues noted in this document were discussed and addressed.  No physical exam was performed (except for noted visual exam findings with Video Visits).    I connected with Ennis Forts. Samet by telephone at 16:47 HRS and verified that I was speaking with the correct person using two identifiers. Location patient: home Location provider: [facility] Persons participating in the virtual visit: patient, physician   I discussed the limitations, risks, security and privacy concerns of performing an evaluation and management service by telephone and the availability of in person appointments. The patient expressed understanding and agreed to proceed. ~~~~~~~~~~~~~~~~~~~~~~~~~~~~~~~~~~~~~~~~~~~~~~~~~~~~~~~~~ Chief Complaint  Patient presents with   Follow-up    Recent CT--c/o dry cough times prod with clear mucus.     HPI Patient is a 75 year old lifelong never smoker with a history of cough of over 13 years duration.  PFTs have been consistent with asthma.  This is a follow-up visit.  Visit had to be done via telephone due to scheduling conflict.  She has been on [place] 200/25, notes that her cough is somewhat better but still persistent.  So has issues with lung nodules then dense fibrotic scarring noted from prior left breast radiation therapy.  She had follow-up CT scan of the chest on 29 May 2020, I have reviewed the findings with the patient.  Basically the findings are consistent with inflammatory change possible atypical mycobacterial infection.  Overall however the exam is unchanged and appears  to be of benign etiology.  She does have bilateral bronchial wall thickening consistent with inflammatory bronchitis.  The patient has not had any fevers, chills or sweats.  Overall she feels she is doing somewhat better but again issues persist with a cough.   Review of Systems A 10 point review of systems was performed and it is as noted above otherwise negative.  Patient Active Problem List   Diagnosis Date Noted   Gallstones 01/30/2020   GERD (gastroesophageal reflux disease) 01/30/2020   Hemangioma 01/30/2020   Hyperlipidemia 01/30/2020   Moderate tricuspid regurgitation 12/07/2017   Osteopenia of multiple sites 11/27/2017   Aortic atherosclerosis ([place]) 05/10/2016   History of kidney cancer 05/12/2015   H/O renal cell carcinoma 05/01/2015   Solitary kidney, acquired 07/12/2013   Glaucoma suspect 05/08/2013   Pseudophakia of both eyes 05/08/2013   Pseudoptosis 05/08/2013   Allergic rhinitis 05/20/2009   Asthma 04/03/2007   Social History   Tobacco Use   Smoking status: Never Smoker   Smokeless tobacco: Never Used  Substance Use Topics   Alcohol use: Not Currently   No Known Allergies  Current Meds  Medication Sig   ascorbic acid (VITAMIN C) 500 MG tablet Take 500 mg by mouth daily.   aspirin 81 MG EC tablet Take 81 mg by mouth daily.   Calcium Carbonate-Vitamin D (CALCIUM-VITAMIN D) 600-125 MG-UNIT TABS Take 1 tablet by mouth daily.   fluticasone furoate-vilanterol (BREO ELLIPTA) 200-25 MCG/INH AEPB Inhale 1 puff into the lungs daily.   latanoprost (XALATAN) 0.005 % ophthalmic solution nightly   lovastatin (MEVACOR) 40 MG tablet 80 mg daily.   montelukast (SINGULAIR) 10 MG tablet TAKE 1  TABLET BY MOUTH EVERY DAY   omeprazole (PRILOSEC) 40 MG capsule Take 1 capsule by mouth daily.   triamcinolone (NASACORT) 55 MCG/ACT AERO nasal inhaler Place 1 spray into the nose in the morning and at bedtime.   Immunization History  Administered Date(s) Administered   Fluad  Quad(high Dose 65+) 11/02/2018, 11/19/2019   Influenza Inj Mdck Quad Pf 11/16/2017   Influenza, High Dose Seasonal PF 12/14/2016   Influenza-Unspecified 11/23/2011, 12/25/2014, 11/10/2015   Moderna Sars-Covid-2 Vaccination 03/23/2019, 04/20/2019, 12/21/2019   Pneumococcal Conjugate-13 03/21/2015   Pneumococcal Polysaccharide-23 10/08/2010   Tdap 02/22/2005   Zoster 04/22/2008   Zoster Recombinat (Shingrix) 09/20/2018, 11/27/2018        Objective:   Physical Exam BP 134/78 (BP Location: Left Arm, Cuff Size: Normal)   Pulse 85   Temp 97.8 F (36.6 C) (Temporal)   Ht 5\' 4"  (1.626 m)   Wt 133 lb 3.2 oz (60.4 kg)   SpO2 97%   BMI 22.86 kg/m    Vitals were obtained as the patient checked in however, the patient had to be switched to a via telephone visit due to a complication that occurred with another patient that the physician had to attend to.   Due to the nature of the visit, no physical exam was performed.  Patient did not exhibit conversational dyspnea during the visit.      Assessment & Plan:     ICD-10-CM   1. Moderate persistent asthma without complication  X51.70    Trial of Trelegy Ellipta 200/62.5/25 Discontinue Breo    2. Chronic cough  R05.3    Likely due to persistent inflammatory changes Aggravated by asthma    3. Multiple lung nodules on CT  R91.8    These remain stable Follow-up CT 18 to 24 months      Meds ordered this encounter  Medications   Fluticasone-Umeclidin-Vilant (TRELEGY ELLIPTA) 200-62.5-25 MCG/INH AEPB    Sig: Inhale 1 puff into the lungs daily.    Dispense:  28 each    Refill:  11   Total visit non-face-to-face time: 20 minutes  We will see the patient in follow-up in 3 months time she is to contact us prior to that time should any new difficulties arise.   Renold Don, MD [facility]   *This note was dictated using voice recognition software/Dragon.  Despite best efforts to proofread, errors can occur which can change  the meaning.  Any change was purely unintentional.

## 2020-07-31 NOTE — Patient Instructions (Signed)
We will give the patient a trial of Trelegy Ellipta  Follow-up in 3 months time

## 2020-08-06 ENCOUNTER — Other Ambulatory Visit: Payer: Self-pay | Admitting: Internal Medicine

## 2020-08-06 DIAGNOSIS — Z1231 Encounter for screening mammogram for malignant neoplasm of breast: Secondary | ICD-10-CM

## 2020-09-01 ENCOUNTER — Ambulatory Visit
Admission: RE | Admit: 2020-09-01 | Discharge: 2020-09-01 | Disposition: A | Discharge status: Home / Self Care | Payer: Medicare PPO | Source: Ambulatory Visit | Attending: Internal Medicine | Admitting: Internal Medicine

## 2020-09-01 ENCOUNTER — Other Ambulatory Visit: Payer: Self-pay

## 2020-09-01 DIAGNOSIS — Z1231 Encounter for screening mammogram for malignant neoplasm of breast: Secondary | ICD-10-CM | POA: Diagnosis not present

## 2020-10-21 ENCOUNTER — Encounter: Payer: Self-pay | Admitting: Pulmonary Disease

## 2020-10-21 ENCOUNTER — Other Ambulatory Visit: Payer: Self-pay

## 2020-10-21 ENCOUNTER — Ambulatory Visit: Payer: Medicare PPO | Admitting: Pulmonary Disease

## 2020-10-21 VITALS — BP 140/82 | HR 98 | Temp 98.1°F | Ht 64.0 in | Wt 132.2 lb

## 2020-10-21 DIAGNOSIS — R053 Chronic cough: Secondary | ICD-10-CM

## 2020-10-21 DIAGNOSIS — R918 Other nonspecific abnormal finding of lung field: Secondary | ICD-10-CM | POA: Diagnosis not present

## 2020-10-21 DIAGNOSIS — R0982 Postnasal drip: Secondary | ICD-10-CM | POA: Diagnosis not present

## 2020-10-21 DIAGNOSIS — J4540 Moderate persistent asthma, uncomplicated: Secondary | ICD-10-CM | POA: Diagnosis not present

## 2020-10-21 MED ORDER — TRELEGY ELLIPTA 200-62.5-25 MCG/INH IN AEPB: 200.0000 ug | INHALATION_SPRAY | Freq: Every day | RESPIRATORY_TRACT | 60 refills | Status: DC

## 2020-10-21 NOTE — Patient Instructions (Signed)
Continue Trelegy as you are doing.  Make sure you rinse your mouth well after you use it.  We will follow-up in 2 to 3 months time call sooner should any new problems arise.

## 2020-10-21 NOTE — Progress Notes (Signed)
Subjective:    Patient ID: Alexandria Carter, female    DOB: 13-Dec-1945, 75 y.o.   MRN: [id number] Chief Complaint  Patient presents with   Follow-up    Asthma, coughing has got better with trelegy.    HPI 75 year old lifelong never smoker with history of cough of over 13 years duration.  PFTs have been most recently consistent with asthma.  Last visit was on 02 Jul 2020 this was a virtual visit via telephone.  During that visit we switched her from [place] 200/25 to Trelegy Ellipta 200/62.5/25, 1 inhalation daily.  She also has been methodical and taking her Zyrtec at bedtime.  She has noted that her cough has improved dramatically.  It is still present but less so.  Appears to be more manageable.  No sputum production.  No hemoptysis.  No fevers, chills or sweats.  No chest pain.  She voices no other complaint.  Otherwise she feels well and looks well.    DATA: 02/04/2020 PFTs: FEV1 1.46 L or 69% predicted, FVC 2.36 L or 83% predicted, FEV1/FVC 62%.  Postbronchodilator 12% net change.  Lung volumes consistent with mild air trapping.  Small airways component with airways reversibility noted.  Diffusion capacity normal by Kco.  Consistent with asthma 02/19/2020 CT chest: No acute cardiopulmonary abnormalities.  Chronic scarring and masslike architectural distortion involving anterior lateral left upper lobe sequela of prior radiation.  Scattered small lung nodules 6 mm or less in diameter repeat CT at 3 to 6 months recommended 05/29/2020 CT chest: Unchanged appearance of dense fibrotic scarring, architectural distortion and volume loss in the left pulmonary apex left upper lobe and lingula consistent with prior left breast radiation therapy.  Multiple tree-in-bud centrilobular nodules on the right consistent with atypical infection, other nodules throughout both lungs as on previous examination.  Bronchial wall thickening.  Review of Systems A 10 point review of systems was performed and it  is as noted above otherwise negative.  Patient Active Problem List   Diagnosis Date Noted   Gallstones 01/30/2020   GERD (gastroesophageal reflux disease) 01/30/2020   Hemangioma 01/30/2020   Hyperlipidemia 01/30/2020   Moderate tricuspid regurgitation 12/07/2017   Osteopenia of multiple sites 11/27/2017   Aortic atherosclerosis ([place]) 05/10/2016   History of kidney cancer 05/12/2015   H/O renal cell carcinoma 05/01/2015   Solitary kidney, acquired 07/12/2013   Glaucoma suspect 05/08/2013   Pseudophakia of both eyes 05/08/2013   Pseudoptosis 05/08/2013   Allergic rhinitis 05/20/2009   Asthma 04/03/2007   Social History   Tobacco Use   Smoking status: Never   Smokeless tobacco: Never  Substance Use Topics   Alcohol use: Not Currently   No Known Allergies Current Meds  Medication Sig   ascorbic acid (VITAMIN C) 500 MG tablet Take 500 mg by mouth daily.   aspirin 81 MG EC tablet Take 81 mg by mouth daily.   Calcium Carbonate-Vitamin D (CALCIUM-VITAMIN D) 600-125 MG-UNIT TABS Take 1 tablet by mouth daily.   cetirizine (ZYRTEC ALLERGY) 10 MG tablet Take 0.5 tablets (5 mg total) by mouth at bedtime.   Fluticasone-Umeclidin-Vilant (TRELEGY ELLIPTA) 200-62.5-25 MCG/INH AEPB Inhale 1 puff into the lungs daily.   Fluticasone-Umeclidin-Vilant (TRELEGY ELLIPTA) 200-62.5-25 MCG/INH AEPB Inhale 200 mcg into the lungs daily.   latanoprost (XALATAN) 0.005 % ophthalmic solution nightly   lovastatin (MEVACOR) 40 MG tablet 80 mg daily.   montelukast (SINGULAIR) 10 MG tablet TAKE 1 TABLET BY MOUTH EVERY DAY   omeprazole (PRILOSEC) 40  MG capsule Take 1 capsule by mouth daily.   triamcinolone (NASACORT) 55 MCG/ACT AERO nasal inhaler Place 1 spray into the nose in the morning and at bedtime.   Immunization History  Administered Date(s) Administered   Fluad Quad(high Dose 65+) 11/02/2018, 11/19/2019   Influenza Inj Mdck Quad Pf 11/16/2017   Influenza, High Dose Seasonal PF 12/14/2016    Influenza-Unspecified 11/23/2011, 12/25/2014, 11/10/2015   Moderna Sars-Covid-2 Vaccination 03/23/2019, 04/20/2019, 12/21/2019   Pneumococcal Conjugate-13 03/21/2015   Pneumococcal Polysaccharide-23 10/08/2010   Tdap 02/22/2005   Zoster Recombinat (Shingrix) 09/20/2018, 11/27/2018   Zoster, Live 04/22/2008       Objective:   Physical Exam BP 140/82 (BP Location: Left Arm, Patient Position: Sitting, Cuff Size: Normal)   Pulse 98   Temp 98.1 F (36.7 C) (Oral)   Ht '5\' 4"'$  (1.626 m)   Wt 132 lb 3.2 oz (60 kg)   SpO2 97%   BMI 22.69 kg/m  GENERAL: Well-developed well-nourished woman, no acute distress.  Slender build.  Fully ambulatory.  Does clear her throat frequently during the visit. HEAD: Normocephalic, atraumatic.  EYES: Pupils equal, round, reactive to light.  No scleral icterus.  MOUTH: Nose/mouth/throat not examined due to masking requirements for COVID 19. NECK: Supple. No thyromegaly. Trachea midline. No JVD.  No adenopathy. PULMONARY: Good air entry bilaterally.  No adventitious sounds. CARDIOVASCULAR: S1 and S2. Regular rate and rhythm.  No rubs, murmurs or gallops heard. ABDOMEN: Benign. MUSCULOSKELETAL: No joint deformity, no clubbing, no edema.  NEUROLOGIC: No focal deficits, no gait disturbance, speech is fluent. SKIN: Intact,warm,dry.  On limited exam, no rashes. PSYCH: Mood and behavior are normal.       Assessment & Plan:     ICD-10-CM   1. Moderate persistent asthma without complication  123456    Continue Trelegy Ellipta 200/62.5/25, 1 inhalation daily Follow-up in 2 to 3 months time    2. Chronic cough  R05.3    Likely element of cough variant asthma Continue Trelegy    3. Post-nasal drip  R09.82    Continue Zyrtec at bedtime    4. Multiple lung nodules on CT  R91.8    Post radiation and inflammatory changes Continue to follow expectantly     Meds ordered this encounter  Medications   Fluticasone-Umeclidin-Vilant (TRELEGY ELLIPTA) 200-62.5-25  MCG/INH AEPB    Sig: Inhale 200 mcg into the lungs daily.    Dispense:  28 each    Refill:  60    Order Specific Question:   Lot Number?    Answer:   [id number]    Order Specific Question:   Expiration Date?    Answer:   04/21/2022    Order Specific Question:   NDC    Answer:   [id number]    Order Specific Question:   Quantity    Answer:   1   We will see the patient in 2 to 3 weeks time she is to contact us prior to that time should any new difficulties arise.  Renold Don, MD Advanced Bronchoscopy [facility]    *This note was dictated using voice recognition software/Dragon.  Despite best efforts to proofread, errors can occur which can change the meaning.  Any change was purely unintentional.

## 2020-12-22 ENCOUNTER — Other Ambulatory Visit: Payer: Self-pay

## 2020-12-22 ENCOUNTER — Encounter: Payer: Self-pay | Admitting: Pulmonary Disease

## 2020-12-22 ENCOUNTER — Ambulatory Visit: Payer: Medicare PPO | Admitting: Pulmonary Disease

## 2020-12-22 VITALS — BP 120/70 | HR 88 | Temp 97.1°F | Ht 64.0 in | Wt 130.2 lb

## 2020-12-22 DIAGNOSIS — R0602 Shortness of breath: Secondary | ICD-10-CM | POA: Diagnosis not present

## 2020-12-22 DIAGNOSIS — R0982 Postnasal drip: Secondary | ICD-10-CM | POA: Diagnosis not present

## 2020-12-22 DIAGNOSIS — J4540 Moderate persistent asthma, uncomplicated: Secondary | ICD-10-CM | POA: Diagnosis not present

## 2020-12-22 DIAGNOSIS — R918 Other nonspecific abnormal finding of lung field: Secondary | ICD-10-CM | POA: Diagnosis not present

## 2020-12-22 NOTE — Patient Instructions (Signed)
We are going to check an echocardiogram to evaluate the artery that goes from the heart to the lungs which sometimes can cause issues with shortness of breath.   We will see her in follow-up in 3 months time call sooner should any new problems arise.

## 2020-12-22 NOTE — Progress Notes (Signed)
Subjective:    Patient ID: Alexandria Carter, female    DOB: April 09, 1945, 75 y.o.   MRN: [id number] Chief Complaint  Patient presents with   Follow-up    Asthma-    HPI 75 year old lifelong never smoker with history of cough of over 13 years duration.  PFTs have been most recently consistent with asthma.  Last visit was on 21 October 2020 this was with me.  During that visit she noted that Trelegy Ellipta 200/62.5/25, 1 inhalation daily has mitigated her cough significantly.  She continues to be compliant with taking her Zyrtec at bedtime.  She has noted that her cough has improved dramatically and continues to do so cough appears every day.  It is still present but less so.  Cough appears to be more manageable.  No sputum production.  No hemoptysis.  No fevers, chills or sweats.  No chest pain.  She has noted continued issues with dyspnea on exertion which have become somewhat more pronounced.  This is usually when she goes upstairs, or uphill when walking.  Also carrying things.  This is out of proportion to her findings on PFT.  She voices no other complaint.  Otherwise she feels well and looks well.  Last echocardiogram was on October 2019, this was a stress echocardiogram performed at [facility].  This showed some moderate TR with mildly elevated pulmonary pressures.  Response to exercise was normal.   DATA: 02/04/2020 PFTs: FEV1 1.46 L or 69% predicted, FVC 2.36 L or 83% predicted, FEV1/FVC 62%.  Postbronchodilator 12% net change.  Lung volumes consistent with mild air trapping.  Small airways component with airways reversibility noted.  Diffusion capacity normal by Kco.  Consistent with asthma 02/19/2020 CT chest: No acute cardiopulmonary abnormalities.  Chronic scarring and masslike architectural distortion involving anterior lateral left upper lobe sequela of prior radiation.  Scattered small lung nodules 6 mm or less in diameter repeat CT at 3 to 6 months recommended 05/29/2020 CT  chest: Unchanged appearance of dense fibrotic scarring, architectural distortion and volume loss in the left pulmonary apex left upper lobe and lingula consistent with prior left breast radiation therapy.  Multiple tree-in-bud centrilobular nodules on the right consistent with atypical infection, other nodules throughout both lungs as on previous examination.  Bronchial wall thickening.  Repeat CT 18 to 24 months.    Review of Systems A 10 point review of systems was performed and it is as noted above otherwise negative.  Patient Active Problem List   Diagnosis Date Noted   Gallstones 01/30/2020   GERD (gastroesophageal reflux disease) 01/30/2020   Hemangioma 01/30/2020   Hyperlipidemia 01/30/2020   Moderate tricuspid regurgitation 12/07/2017   Osteopenia of multiple sites 11/27/2017   Aortic atherosclerosis ([place]) 05/10/2016   History of kidney cancer 05/12/2015   H/O renal cell carcinoma 05/01/2015   Solitary kidney, acquired 07/12/2013   Glaucoma suspect 05/08/2013   Pseudophakia of both eyes 05/08/2013   Pseudoptosis 05/08/2013   Allergic rhinitis 05/20/2009   Asthma 04/03/2007   Social History   Tobacco Use   Smoking status: Never   Smokeless tobacco: Never  Substance Use Topics   Alcohol use: Not Currently   No Known Allergies  Current Meds  Medication Sig   ascorbic acid (VITAMIN C) 500 MG tablet Take 500 mg by mouth daily.   aspirin 81 MG EC tablet Take 81 mg by mouth daily.   Calcium Carbonate-Vitamin D (CALCIUM-VITAMIN D) 600-125 MG-UNIT TABS Take 1 tablet by mouth daily.  cetirizine (ZYRTEC ALLERGY) 10 MG tablet Take 0.5 tablets (5 mg total) by mouth at bedtime.   Fluticasone-Umeclidin-Vilant (TRELEGY ELLIPTA) 200-62.5-25 MCG/INH AEPB Inhale 1 puff into the lungs daily.   latanoprost (XALATAN) 0.005 % ophthalmic solution nightly   lovastatin (MEVACOR) 40 MG tablet 80 mg daily.   montelukast (SINGULAIR) 10 MG tablet TAKE 1 TABLET BY MOUTH EVERY DAY   omeprazole  (PRILOSEC) 40 MG capsule Take 1 capsule by mouth daily.   triamcinolone (NASACORT) 55 MCG/ACT AERO nasal inhaler Place 1 spray into the nose in the morning and at bedtime.   Immunization History  Administered Date(s) Administered   Fluad Quad(high Dose 65+) 11/02/2018, 11/19/2019   Influenza Inj Mdck Quad Pf 11/16/2017   Influenza, High Dose Seasonal PF 12/14/2016, 12/16/2020   Influenza-Unspecified 11/23/2011, 12/25/2014, 11/10/2015   Moderna Covid-19 Vaccine Bivalent Booster 65yrs & up 12/02/2020   Moderna Sars-Covid-2 Vaccination 03/23/2019, 04/20/2019, 12/21/2019, 07/12/2020   Pfizer Covid-19 Vaccine Bivalent Booster 62yrs & up 12/02/2020   Pneumococcal Conjugate-13 03/21/2015   Pneumococcal Polysaccharide-23 10/08/2010   Tdap 02/22/2005   Zoster Recombinat (Shingrix) 09/20/2018, 11/27/2018   Zoster, Live 04/22/2008       Objective:   Physical Exam BP 120/70 (BP Location: Left Arm, Patient Position: Sitting, Cuff Size: Normal)   Pulse 88   Temp (!) 97.1 F (36.2 C) (Oral)   Ht 5\' 4"  (1.626 m)   Wt 130 lb 3.2 oz (59.1 kg)   SpO2 97%   BMI 22.35 kg/m  GENERAL: Well-developed well-nourished woman, no acute distress.  Slender build.  Fully ambulatory.  No throat clearing noted today. HEAD: Normocephalic, atraumatic.  EYES: Pupils equal, round, reactive to light.  No scleral icterus.  MOUTH: Nose/mouth/throat not examined due to masking requirements for COVID 19. NECK: Supple. No thyromegaly. Trachea midline. No JVD.  No adenopathy. PULMONARY: Good air entry bilaterally.  No adventitious sounds. CARDIOVASCULAR: S1 and S2. Regular rate and rhythm.  No rubs, murmurs or gallops heard. ABDOMEN: Benign. MUSCULOSKELETAL: No joint deformity, no clubbing, no edema.  NEUROLOGIC: No focal deficits, no gait disturbance, speech is fluent. SKIN: Intact,warm,dry.  On limited exam, no rashes. PSYCH: Mood and behavior are normal.       Assessment & Plan:     ICD-10-CM   1. Moderate  persistent asthma without complication  M03.49    Continue Trelegy for now Chronic cough likely related to this    2. Shortness of breath  R06.02 ECHOCARDIOGRAM COMPLETE   Out of proportion to findings on PFTs Will obtain 2D echo to reevaluate for potential PAH    3. Post-nasal drip  R09.82    Aggravates her cough Continue Zyrtec at nighttime    4. Multiple lung nodules on CT  R91.8    Follow-up CT chest September 2023      We will notify the patient with the results from the echocardiogram as soon as these become available.  We will see her in follow-up in 3 months time she is to contact us prior to that time should any new difficulties arise.   Renold Don, MD Advanced Bronchoscopy [facility]    *This note was dictated using voice recognition software/Dragon.  Despite best efforts to proofread, errors can occur which can change the meaning.  Any change was purely unintentional.

## 2020-12-25 ENCOUNTER — Other Ambulatory Visit: Payer: Self-pay

## 2020-12-25 ENCOUNTER — Ambulatory Visit
Admission: RE | Admit: 2020-12-25 | Discharge: 2020-12-25 | Disposition: A | Discharge status: Home / Self Care | Payer: Medicare PPO | Source: Ambulatory Visit | Attending: Pulmonary Disease | Admitting: Pulmonary Disease

## 2020-12-25 DIAGNOSIS — I340 Nonrheumatic mitral (valve) insufficiency: Secondary | ICD-10-CM | POA: Insufficient documentation

## 2020-12-25 DIAGNOSIS — R0600 Dyspnea, unspecified: Secondary | ICD-10-CM | POA: Diagnosis not present

## 2020-12-25 DIAGNOSIS — R0602 Shortness of breath: Secondary | ICD-10-CM | POA: Diagnosis not present

## 2020-12-25 DIAGNOSIS — Z853 Personal history of malignant neoplasm of breast: Secondary | ICD-10-CM | POA: Insufficient documentation

## 2020-12-25 DIAGNOSIS — N189 Chronic kidney disease, unspecified: Secondary | ICD-10-CM | POA: Diagnosis not present

## 2020-12-25 DIAGNOSIS — I358 Other nonrheumatic aortic valve disorders: Secondary | ICD-10-CM | POA: Insufficient documentation

## 2020-12-25 LAB — ECHOCARDIOGRAM COMPLETE
AR max vel: 2.02 cm2
AV Area VTI: 2.19 cm2
AV Area mean vel: 2.07 cm2
AV Mean grad: 5 mmHg
AV Peak grad: 8 mmHg
Ao pk vel: 1.41 m/s
Area-P 1/2: 3.72 cm2
MV VTI: 3.44 cm2
S' Lateral: 3.26 cm

## 2020-12-25 NOTE — Progress Notes (Signed)
*  PRELIMINARY RESULTS* Echocardiogram 2D Echocardiogram has been performed.  Alexandria Carter 12/25/2020, 9:44 AM

## 2021-03-27 ENCOUNTER — Encounter: Payer: Self-pay | Admitting: Pulmonary Disease

## 2021-03-27 ENCOUNTER — Other Ambulatory Visit: Payer: Self-pay

## 2021-03-27 ENCOUNTER — Ambulatory Visit: Payer: Medicare PPO | Admitting: Pulmonary Disease

## 2021-03-27 VITALS — BP 110/70 | HR 95 | Temp 97.3°F | Ht 64.0 in | Wt 132.2 lb

## 2021-03-27 DIAGNOSIS — J4540 Moderate persistent asthma, uncomplicated: Secondary | ICD-10-CM

## 2021-03-27 DIAGNOSIS — R918 Other nonspecific abnormal finding of lung field: Secondary | ICD-10-CM | POA: Diagnosis not present

## 2021-03-27 DIAGNOSIS — R053 Chronic cough: Secondary | ICD-10-CM

## 2021-03-27 DIAGNOSIS — K219 Gastro-esophageal reflux disease without esophagitis: Secondary | ICD-10-CM

## 2021-03-27 MED ORDER — BENZONATATE 200 MG PO CAPS: 200.0000 mg | ORAL_CAPSULE | Freq: Three times a day (TID) | ORAL | 0 refills | Status: DC | PRN

## 2021-03-27 NOTE — Progress Notes (Signed)
Subjective:    Patient ID: Alexandria Carter, female    DOB: 10/23/1945, 76 y.o.   MRN: [id number] Chief Complaint  Patient presents with   Follow-up   HPI This is a 76 year old lifelong never smoker with history of cough of over 13 years duration.  PFTs have been consistent with asthma.  Last visit was on the 22 November 2020 .  During that visit he continued Trelegy Ellipta 200/62.5/25, 1 inhalation daily as this has helped her cough significantly.  An echocardiogram was ordered to exclude pulmonary hypertension, this showed that she does have normal pulmonary artery pressure however she does have some mild diastolic dysfunction and mild mitral valve regurgitation.  She had complained about increasing dyspnea at her last visit.  This has however has abated.  No other abnormalities.  She continues to be compliant with taking her Zyrtec at bedtime.  Here recently she has had some breakthrough symptoms of GERD despite taking Prilosec.  She can note that when her GERD is poorly controlled her cough gets worse.  No sputum production.  No hemoptysis.  No fevers, chills or sweats.  No chest pain.  As noted she had had complaint of dyspnea previously however this has resolved.  Otherwise she feels well and looks well.    DATA: 02/04/2020 PFTs: FEV1 1.46 L or 69% predicted, FVC 2.36 L or 83% predicted, FEV1/FVC 62%.  Postbronchodilator 12% net change.  Lung volumes consistent with mild air trapping.  Small airways component with airways reversibility noted.  Diffusion capacity normal by Kco.  Consistent with asthma 02/19/2020 CT chest: No acute cardiopulmonary abnormalities.  Chronic scarring and masslike architectural distortion involving anterior lateral left upper lobe sequela of prior radiation.  Scattered small lung nodules 6 mm or less in diameter repeat CT at 3 to 6 months recommended 05/29/2020 CT chest: Unchanged appearance of dense fibrotic scarring, architectural distortion and volume loss in the  left pulmonary apex left upper lobe and lingula consistent with prior left breast radiation therapy.  Multiple tree-in-bud centrilobular nodules on the right consistent with atypical infection, other nodules throughout both lungs as on previous examination.  Bronchial wall thickening.  Repeat CT 18 to 24 months. 12/25/2020 echocardiogram: LVEF 55 to 60%, mild LVH, grade 1 diastolic dysfunction, normal pulmonary artery systolic pressure, mild mitral regurgitation.   Review of Systems A 10 point review of systems was performed and it is as noted above otherwise negative.  Patient Active Problem List   Diagnosis Date Noted   Gallstones 01/30/2020   GERD (gastroesophageal reflux disease) 01/30/2020   Hemangioma 01/30/2020   Hyperlipidemia 01/30/2020   Moderate tricuspid regurgitation 12/07/2017   Osteopenia of multiple sites 11/27/2017   Aortic atherosclerosis ([place]) 05/10/2016   History of kidney cancer 05/12/2015   H/O renal cell carcinoma 05/01/2015   Solitary kidney, acquired 07/12/2013   Glaucoma suspect 05/08/2013   Pseudophakia of both eyes 05/08/2013   Pseudoptosis 05/08/2013   Allergic rhinitis 05/20/2009   Asthma 04/03/2007   Social History   Tobacco Use   Smoking status: Never   Smokeless tobacco: Never  Substance Use Topics   Alcohol use: Not Currently   No Known Allergies Current Meds  Medication Sig   ascorbic acid (VITAMIN C) 500 MG tablet Take 500 mg by mouth daily.   aspirin 81 MG EC tablet Take 81 mg by mouth daily.   Calcium Carbonate-Vitamin D (CALCIUM-VITAMIN D) 600-125 MG-UNIT TABS Take 1 tablet by mouth daily.   cetirizine (ZYRTEC ALLERGY) 10  MG tablet Take 0.5 tablets (5 mg total) by mouth at bedtime.   Fluticasone-Umeclidin-Vilant (TRELEGY ELLIPTA) 200-62.5-25 MCG/INH AEPB Inhale 1 puff into the lungs daily.   latanoprost (XALATAN) 0.005 % ophthalmic solution nightly   lovastatin (MEVACOR) 40 MG tablet 80 mg daily.   montelukast (SINGULAIR) 10 MG tablet  TAKE 1 TABLET BY MOUTH EVERY DAY   omeprazole (PRILOSEC) 40 MG capsule Take 1 capsule by mouth daily.   triamcinolone (NASACORT) 55 MCG/ACT AERO nasal inhaler Place 1 spray into the nose in the morning and at bedtime.   Immunization History  Administered Date(s) Administered   Fluad Quad(high Dose 65+) 11/02/2018, 11/19/2019   Influenza Inj Mdck Quad Pf 11/16/2017   Influenza, High Dose Seasonal PF 12/14/2016, 12/16/2020   Influenza-Unspecified 11/23/2011, 12/25/2014, 11/10/2015   Moderna Covid-19 Vaccine Bivalent Booster 2yrs & up 12/02/2020   Moderna Sars-Covid-2 Vaccination 03/23/2019, 04/20/2019, 12/21/2019, 07/12/2020   Pfizer Covid-19 Vaccine Bivalent Booster 7yrs & up 12/02/2020   Pneumococcal Conjugate-13 03/21/2015   Pneumococcal Polysaccharide-23 10/08/2010   Tdap 02/22/2005   Zoster Recombinat (Shingrix) 09/20/2018, 11/27/2018   Zoster, Live 04/22/2008       Objective:   Physical Exam BP 110/70 (BP Location: Left Arm, Patient Position: Sitting, Cuff Size: Normal)   Pulse 95   Temp (!) 97.3 F (36.3 C) (Oral)   Ht 5\' 4"  (1.626 m)   Wt 132 lb 3.2 oz (60 kg)   SpO2 98%   BMI 22.69 kg/m  GENERAL: Well-developed well-nourished woman, no acute distress.  Slender build.  Fully ambulatory.  No throat clearing noted today. HEAD: Normocephalic, atraumatic.  EYES: Pupils equal, round, reactive to light.  No scleral icterus.  MOUTH: Nose/mouth/throat not examined due to masking requirements for COVID 19. NECK: Supple. No thyromegaly. Trachea midline. No JVD.  No adenopathy. PULMONARY: Good air entry bilaterally.  No adventitious sounds. CARDIOVASCULAR: S1 and S2. Regular rate and rhythm.  No rubs, murmurs or gallops heard. ABDOMEN: Benign. MUSCULOSKELETAL: No joint deformity, no clubbing, no edema.  NEUROLOGIC: No focal deficits, no gait disturbance, speech is fluent. SKIN: Intact,warm,dry.  On limited exam, no rashes. PSYCH: Mood and behavior are normal.        Assessment & Plan:     ICD-10-CM   1. Chronic cough  R05.3    Improved overall but recent flare due to worsening GERD Tessalon Perles as needed Optimize GERD management    2. Chronic GERD  K21.9    Add Pepcid at at bedtime Continue Prilosec in the morning Antireflux measures    3. Moderate persistent asthma without complication  D42.87    Continue Trelegy Continue as needed albuterol    4. Multiple lung nodules on CT  R91.8    Mostly related to postradiation changes Monitor with chest CT     Meds ordered this encounter  Medications   benzonatate (TESSALON) 200 MG capsule    Sig: Take 1 capsule (200 mg total) by mouth 3 (three) times daily as needed for cough.    Dispense:  20 capsule    Refill:  0   Will see the patient in follow-up in 2-3 3 months time she is to contact us prior to that time should any new difficulties arise.  Renold Don, MD Advanced Bronchoscopy PCCM [facility]    *This note was dictated using voice recognition software/Dragon.  Despite best efforts to proofread, errors can occur which can change the meaning. Any transcriptional errors that result from this process are unintentional and may  not be fully corrected at the time of dictation.

## 2021-03-27 NOTE — Patient Instructions (Signed)
Continue using your Trelegy as you are doing.  At nighttime you may add Pepcid 20 mg to take along with your Zyrtec (antihistamine).  This may help some with your cough as well.  Continue taking your Prilosec in the morning.  We have written a prescription for Tessalon Perles to help mitigate the cough.  Avoid mint or menthol on cough drops for the time being.  You may use honey drops or [place] cherry cough drops.  Alternatively you can also use Marolyn Hammock Rancher hard candy to help with alleviating the cough when it occurs.  We will see him in follow-up in 2 to 3 months time call sooner should any new problems arise.

## 2021-05-16 ENCOUNTER — Other Ambulatory Visit: Payer: Self-pay | Admitting: Pulmonary Disease

## 2021-06-29 ENCOUNTER — Ambulatory Visit: Payer: Medicare PPO | Admitting: Pulmonary Disease

## 2021-06-29 ENCOUNTER — Encounter: Payer: Self-pay | Admitting: Pulmonary Disease

## 2021-06-29 VITALS — BP 136/82 | HR 89 | Temp 97.7°F | Ht 64.0 in | Wt 133.2 lb

## 2021-06-29 DIAGNOSIS — R918 Other nonspecific abnormal finding of lung field: Secondary | ICD-10-CM

## 2021-06-29 DIAGNOSIS — K219 Gastro-esophageal reflux disease without esophagitis: Secondary | ICD-10-CM

## 2021-06-29 DIAGNOSIS — J4540 Moderate persistent asthma, uncomplicated: Secondary | ICD-10-CM

## 2021-06-29 DIAGNOSIS — R053 Chronic cough: Secondary | ICD-10-CM

## 2021-06-29 MED ORDER — TRELEGY ELLIPTA 100-62.5-25 MCG/ACT IN AEPB: 1.0000 | INHALATION_SPRAY | Freq: Every day | RESPIRATORY_TRACT | 11 refills | Status: DC

## 2021-06-29 NOTE — Progress Notes (Signed)
Subjective:    Patient ID: Alexandria Carter, female    DOB: [birth year], 76 y.o.   MRN: [id number] Patient Care Team: Lynnea Ferrier, MD as PCP - General (Internal Medicine)  Chief Complaint  Patient presents with   Follow-up    SOB with incline and dry cough.     HPI This is a 76 year old lifelong never smoker with history of cough of over 13 years duration.  PFTs have been consistent with asthma.  Last visit was on the 27 March 2021.  During that visit she had just had a recent flare due to worsening GERD issues.  Since her GERD is better controlled off is likewise better controlled.  She received a prescription for Tessalon Perles during that visit and was instructed to continue Trelegy Ellipta 200/62.5/25, 1 inhalation daily as this has helped her cough significantly.  Previously, echocardiogram was ordered to exclude pulmonary hypertension, this showed that she does have normal pulmonary artery pressure however she does have some mild diastolic dysfunction and mild mitral valve regurgitation.  She continues to be compliant with taking her Zyrtec at bedtime.  She has not had any sputum production.  No hemoptysis.  No fevers, chills or sweats.  No chest pain.  As noted she had had complaint of dyspnea previously however this has resolved.  Otherwise she feels well and looks well.   Recall she also has been followed for multiple lung nodules.  She is due for follow-up CT however, she wants to postpone this.  Her last study was in April of 2022 with the recommendation of repeating in 18 to 24 months that study was stable from prior.  DATA: 02/04/2020 PFTs: FEV1 1.46 L or 69% predicted, FVC 2.36 L or 83% predicted, FEV1/FVC 62%. Postbronchodilator 12% net change.  Lung volumes consistent with mild air trapping.  Small airways component with airways reversibility noted.  Diffusion capacity normal by Kco.  Consistent with asthma 02/19/2020 CT chest: No acute cardiopulmonary abnormalities.   Chronic scarring and masslike architectural distortion involving anterior lateral left upper lobe sequela of prior radiation.  Scattered small lung nodules 6 mm or less in diameter repeat CT at 3 to 6 months recommended 05/29/2020 CT chest: Unchanged appearance of dense fibrotic scarring, architectural distortion and volume loss in the left pulmonary apex left upper lobe and lingula consistent with prior left breast radiation therapy.  Multiple tree-in-bud centrilobular nodules on the right consistent with atypical infection, other nodules throughout both lungs as on previous examination.  Bronchial wall thickening.  Repeat CT 18 to 24 months. 12/25/2020 echocardiogram: LVEF 55 to 60%, mild LVH, grade 1 diastolic dysfunction, normal pulmonary artery systolic pressure, mild mitral regurgitation.   Review of Systems A 10 point review of systems was performed and it is as noted above otherwise negative.  Patient Active Problem List   Diagnosis Date Noted   Gallstones 01/30/2020   GERD (gastroesophageal reflux disease) 01/30/2020   Hemangioma 01/30/2020   Hyperlipidemia 01/30/2020   Moderate tricuspid regurgitation 12/07/2017   Osteopenia of multiple sites 11/27/2017   Aortic atherosclerosis ([facility]) 05/10/2016   History of kidney cancer 05/12/2015   H/O renal cell carcinoma 05/01/2015   Solitary kidney, acquired 07/12/2013   Glaucoma suspect 05/08/2013   Pseudophakia of both eyes 05/08/2013   Pseudoptosis 05/08/2013   Allergic rhinitis 05/20/2009   Asthma 04/03/2007   Social History   Tobacco Use   Smoking status: Never   Smokeless tobacco: Never  Substance Use Topics  Alcohol use: Not Currently   No Known Allergies  Current Meds  Medication Sig   ascorbic acid (VITAMIN C) 500 MG tablet Take 500 mg by mouth daily.   aspirin 81 MG EC tablet Take 81 mg by mouth daily.   Calcium Carbonate-Vitamin D (CALCIUM-VITAMIN D) 600-125 MG-UNIT TABS Take 1 tablet by mouth daily.    Fluticasone-Umeclidin-Vilant (TRELEGY ELLIPTA) 200-62.5-25 MCG/INH AEPB Inhale 1 puff into the lungs daily.   latanoprost (XALATAN) 0.005 % ophthalmic solution nightly   lovastatin (MEVACOR) 40 MG tablet 80 mg daily.   montelukast (SINGULAIR) 10 MG tablet TAKE 1 TABLET BY MOUTH EVERY DAY   omeprazole (PRILOSEC) 40 MG capsule Take 1 capsule by mouth daily.   triamcinolone (NASACORT) 55 MCG/ACT AERO nasal inhaler Place 1 spray into the nose in the morning and at bedtime.   [DISCONTINUED] benzonatate (TESSALON) 200 MG capsule Take 1 capsule (200 mg total) by mouth 3 (three) times daily as needed for cough.   Immunization History  Administered Date(s) Administered   Fluad Quad(high Dose 65+) 11/02/2018, 11/19/2019   Influenza Inj Mdck Quad Pf 11/16/2017   Influenza, High Dose Seasonal PF 12/14/2016, 12/16/2020   Influenza-Unspecified 11/23/2011, 12/25/2014, 11/10/2015   Moderna Covid-19 Vaccine Bivalent Booster 77yrs & up 12/02/2020   Moderna Sars-Covid-2 Vaccination 03/23/2019, 04/20/2019, 12/21/2019, 07/12/2020   Pfizer Covid-19 Vaccine Bivalent Booster 77yrs & up 12/02/2020   Pneumococcal Conjugate-13 03/21/2015   Pneumococcal Polysaccharide-23 10/08/2010   Tdap 02/22/2005   Zoster Recombinat (Shingrix) 09/20/2018, 11/27/2018   Zoster, Live 04/22/2008       Objective:   Physical Exam BP 136/82 (BP Location: Left Arm, Cuff Size: Normal)   Pulse 89   Temp 97.7 F (36.5 C) (Temporal)   Ht 5\' 4"  (1.626 m)   Wt 133 lb 3.2 oz (60.4 kg)   SpO2 98%   BMI 22.86 kg/m  GENERAL: Well-developed well-nourished woman, no acute distress.  Slender build.  Fully ambulatory.  No throat clearing noted today. HEAD: Normocephalic, atraumatic.  EYES: Pupils equal, round, reactive to light.  No scleral icterus.  MOUTH: Nose/mouth/throat not examined due to masking requirements for COVID 19. NECK: Supple. No thyromegaly. Trachea midline. No JVD.  No adenopathy. PULMONARY: Good air entry bilaterally.   No adventitious sounds. CARDIOVASCULAR: S1 and S2. Regular rate and rhythm.  No rubs, murmurs or gallops heard. ABDOMEN: Benign. MUSCULOSKELETAL: No joint deformity, no clubbing, no edema.  NEUROLOGIC: No focal deficits, no gait disturbance, speech is fluent. SKIN: Intact,warm,dry.  On limited exam, no rashes. PSYCH: Mood and behavior are normal.      Assessment & Plan:     ICD-10-CM   1. Moderate persistent asthma without complication  J45.40    Continue Trelegy Ellipta 200 Continue as needed albuterol    2. Chronic cough  R05.3    Multifactorial Asthma/chronic GERD    3. Chronic GERD  K21.9    Continue antireflux measures Continue omeprazole and Pepcid    4. Multiple lung nodules on CT  R91.8    Will schedule a follow-up appointment      Meds ordered this encounter  Medications   Fluticasone-Umeclidin-Vilant (TRELEGY ELLIPTA) 100-62.5-25 MCG/ACT AEPB    Sig: Inhale 1 puff into the lungs daily.    Dispense:  28 each    Refill:  11   The patient appears to be relatively well compensated.  We will see her in follow-up in 6 months time she is to contact us prior to that time should any new difficulties arise.  Gailen Shelter, MD Advanced Bronchoscopy PCCM  [facility]    *This note was dictated using voice recognition software/Dragon.  Despite best efforts to proofread, errors can occur which can change the meaning. Any transcriptional errors that result from this process are unintentional and may not be fully corrected at the time of dictation.

## 2021-06-29 NOTE — Patient Instructions (Signed)
I have refilled your Trelegy Ellipta. ? ?Wait on repeating CT of the chest when you come back. ? ?We will see you back in 6 months time call sooner should any new problems arise. ?

## 2021-08-19 ENCOUNTER — Other Ambulatory Visit: Payer: Self-pay | Admitting: Internal Medicine

## 2021-08-19 DIAGNOSIS — Z1231 Encounter for screening mammogram for malignant neoplasm of breast: Secondary | ICD-10-CM

## 2021-09-11 ENCOUNTER — Ambulatory Visit
Admission: RE | Admit: 2021-09-11 | Discharge: 2021-09-11 | Disposition: A | Discharge status: Home / Self Care | Payer: Medicare PPO | Source: Ambulatory Visit | Attending: Internal Medicine | Admitting: Internal Medicine

## 2021-09-11 DIAGNOSIS — Z1231 Encounter for screening mammogram for malignant neoplasm of breast: Secondary | ICD-10-CM | POA: Diagnosis not present

## 2021-12-28 ENCOUNTER — Ambulatory Visit: Payer: Medicare PPO | Admitting: Pulmonary Disease

## 2021-12-28 ENCOUNTER — Encounter: Payer: Self-pay | Admitting: Pulmonary Disease

## 2021-12-28 VITALS — BP 118/74 | HR 76 | Temp 97.8°F | Ht 64.0 in | Wt 133.4 lb

## 2021-12-28 DIAGNOSIS — R053 Chronic cough: Secondary | ICD-10-CM | POA: Insufficient documentation

## 2021-12-28 DIAGNOSIS — R0982 Postnasal drip: Secondary | ICD-10-CM

## 2021-12-28 DIAGNOSIS — J4540 Moderate persistent asthma, uncomplicated: Secondary | ICD-10-CM

## 2021-12-28 LAB — NITRIC OXIDE: Nitric Oxide: 15

## 2021-12-28 NOTE — Progress Notes (Signed)
Subjective:    Patient ID: Alexandria Carter, female    DOB: December 10, 1945, 76 y.o.   MRN: [id number] Patient Care Team: Adin Hector, MD as PCP - General (Internal Medicine) Tyler Pita, MD as Consulting Physician (Pulmonary Disease)  Chief Complaint  Patient presents with   Follow-up    Dry cough in the morning. No SOB or wheezing.    HPI This is 76 year old lifelong never smoker with a history of cough spanning 13 to 14 years.  PFTs performed 01/2020 were consistent with asthma.  She has been treated with Trelegy Ellipta 200 since and has noted improvement on her cough.  Her main issues with cough is usually in the mornings.  When she takes Zyrtec at bedtime she notes that the cough is less.  She has noted a return of the cough because she stopped taking her Zyrtec thinking that allergy season was "over".  She feels that the cough overall is very manageable.  She has no sputum production no hemoptysis.  No fevers, chills or sweats.  No chest pain.  She has not had any shortness of breath of late.  No wheezing.   DATA: 02/04/2020 PFTs: FEV1 1.46 L or 69% predicted, FVC 2.36 L or 83% predicted, FEV1/FVC 62%.  Postbronchodilator 12% net change.  Lung volumes consistent with mild air trapping.  Small airways component with airways reversibility noted.  Diffusion capacity normal by Kco.  Consistent with asthma 02/19/2020 CT chest: No acute cardiopulmonary abnormalities.  Chronic scarring and masslike architectural distortion involving anterior lateral left upper lobe sequela of prior radiation.  Scattered small lung nodules 6 mm or less in diameter repeat CT at 3 to 6 months recommended 05/29/2020 CT chest: Unchanged appearance of dense fibrotic scarring, architectural distortion and volume loss in the left pulmonary apex left upper lobe and lingula consistent with prior left breast radiation therapy.  Multiple tree-in-bud centrilobular nodules on the right consistent with atypical  infection, other nodules throughout both lungs as on previous examination.  Bronchial wall thickening.  Repeat CT 18 to 24 months.  Review of Systems A 10 point review of systems was performed and it is as noted above otherwise negative.  Patient Active Problem List   Diagnosis Date Noted   Chronic cough 12/28/2021   Post-nasal drip 12/28/2021   Gallstones 01/30/2020   GERD (gastroesophageal reflux disease) 01/30/2020   Hemangioma 01/30/2020   Hyperlipidemia 01/30/2020   Moderate tricuspid regurgitation 12/07/2017   Osteopenia of multiple sites 11/27/2017   Aortic atherosclerosis ([place]) 05/10/2016   Stage 3a chronic kidney disease ([place]) 11/10/2015   History of kidney cancer 05/12/2015   H/O renal cell carcinoma 05/01/2015   Solitary kidney, acquired 07/12/2013   Glaucoma suspect 05/08/2013   Pseudophakia of both eyes 05/08/2013   Pseudoptosis 05/08/2013   Allergic rhinitis 05/20/2009   Asthma 04/03/2007   Social History   Tobacco Use   Smoking status: Never   Smokeless tobacco: Never  Substance Use Topics   Alcohol use: Not Currently   No Known Allergies  Current Meds  Medication Sig   ascorbic acid (VITAMIN C) 500 MG tablet Take 500 mg by mouth daily.   aspirin 81 MG EC tablet Take 81 mg by mouth daily.   Calcium Carbonate-Vitamin D (CALCIUM-VITAMIN D) 600-125 MG-UNIT TABS Take 1 tablet by mouth daily.   Fluticasone-Umeclidin-Vilant (TRELEGY ELLIPTA) 100-62.5-25 MCG/ACT AEPB Inhale 1 puff into the lungs daily.   latanoprost (XALATAN) 0.005 % ophthalmic solution nightly   lovastatin (MEVACOR)  40 MG tablet 80 mg daily.   montelukast (SINGULAIR) 10 MG tablet TAKE 1 TABLET BY MOUTH EVERY DAY   omeprazole (PRILOSEC) 40 MG capsule Take 1 capsule by mouth daily.   triamcinolone (NASACORT) 55 MCG/ACT AERO nasal inhaler Place 1 spray into the nose in the morning and at bedtime.   Immunization History  Administered Date(s) Administered   Fluad Quad(high Dose 65+) 11/02/2018,  11/19/2019, 12/04/2021   Influenza Inj Mdck Quad Pf 11/16/2017   Influenza, High Dose Seasonal PF 12/14/2016, 12/16/2020   Influenza-Unspecified 11/23/2011, 12/25/2014, 11/10/2015   Moderna Covid-19 Vaccine Bivalent Booster 56yr & up 12/02/2020   Moderna Sars-Covid-2 Vaccination 03/23/2019, 04/20/2019, 12/21/2019, 07/12/2020   Pfizer Covid-19 Vaccine Bivalent Booster 166yr& up 12/02/2020   Pneumococcal Conjugate-13 03/21/2015   Pneumococcal Polysaccharide-23 10/08/2010   Tdap 02/22/2005   Zoster Recombinat (Shingrix) 09/20/2018, 11/27/2018   Zoster, Live 04/22/2008      Objective:   Physical Exam BP 118/74 (BP Location: Right Arm, Cuff Size: Normal)   Pulse 76   Temp 97.8 F (36.6 C)   Ht '5\' 4"'$  (1.626 m)   Wt 133 lb 6.4 oz (60.5 kg)   SpO2 97%   BMI 22.90 kg/m  GENERAL: Well-developed well-nourished woman, no acute distress.  Slender build.  Fully ambulatory.  No throat clearing noted today. HEAD: Normocephalic, atraumatic.  EYES: Pupils equal, round, reactive to light.  No scleral icterus.  MOUTH: Dentition intact, oral mucosa moist. NECK: Supple. No thyromegaly. Trachea midline. No JVD.  No adenopathy. PULMONARY: Good air entry bilaterally.  No adventitious sounds. CARDIOVASCULAR: S1 and S2. Regular rate and rhythm.  No rubs, murmurs or gallops heard. ABDOMEN: Benign. MUSCULOSKELETAL: No joint deformity, no clubbing, no edema.  NEUROLOGIC: No focal deficits, no gait disturbance, speech is fluent. SKIN: Intact,warm,dry.  On limited exam, no rashes. PSYCH: Mood and behavior are normal.   Lab Results  Component Value Date   NITRICOXIDE 15 12/28/2021       Assessment & Plan:     ICD-10-CM   1. Moderate persistent asthma without complication  J4Q59.56itric oxide   Appears well controlled on current regimen FeNO 15 Continue Trelegy and as needed albuterol    2. Post-nasal drip  R09.82    Resume Zyrtec at bedtime Appears to drive her cough in the mornings    3.  Chronic cough  R05.3    Suspect upper airway cough syndrome Nasal hygiene/Zyrtec     Orders Placed This Encounter  Procedures   Nitric oxide   Patient appears to be doing well.  We will see her in follow-up in 4 to 6 months time she is to contact usKorearior to that time should any new difficulties arise.  C.Renold DonMD Advanced Bronchoscopy PCCM  [facility]    *This note was dictated using voice recognition software/Dragon.  Despite best efforts to proofread, errors can occur which can change the meaning. Any transcriptional errors that result from this process are unintentional and may not be fully corrected at the time of dictation.

## 2021-12-28 NOTE — Patient Instructions (Signed)
Continue using Zyrtec at nighttime to see if this helps with your cough during the morning.  Your lungs sounded very clear today.  Your level of inflammation was 15 which was low to normal.  We will see you in follow-up in 4 to 6 months time call sooner should any new problems arise.

## 2022-01-02 ENCOUNTER — Other Ambulatory Visit: Payer: Self-pay | Admitting: Pulmonary Disease

## 2022-03-27 ENCOUNTER — Encounter: Payer: Self-pay | Admitting: Pulmonary Disease

## 2022-04-05 ENCOUNTER — Other Ambulatory Visit: Payer: Self-pay | Admitting: Pulmonary Disease

## 2022-05-06 ENCOUNTER — Other Ambulatory Visit: Payer: Self-pay | Admitting: Pulmonary Disease

## 2022-05-26 ENCOUNTER — Encounter: Payer: Self-pay | Admitting: Pulmonary Disease

## 2022-05-26 ENCOUNTER — Ambulatory Visit: Payer: Medicare PPO | Admitting: Pulmonary Disease

## 2022-05-26 VITALS — BP 146/84 | HR 82 | Temp 96.8°F | Ht 64.0 in | Wt 133.2 lb

## 2022-05-26 DIAGNOSIS — J4540 Moderate persistent asthma, uncomplicated: Secondary | ICD-10-CM | POA: Diagnosis not present

## 2022-05-26 DIAGNOSIS — R053 Chronic cough: Secondary | ICD-10-CM

## 2022-05-26 DIAGNOSIS — R918 Other nonspecific abnormal finding of lung field: Secondary | ICD-10-CM | POA: Diagnosis not present

## 2022-05-26 LAB — NITRIC OXIDE: Nitric Oxide: 12

## 2022-05-26 MED ORDER — TRELEGY ELLIPTA 100-62.5-25 MCG/ACT IN AEPB: 1.0000 | INHALATION_SPRAY | Freq: Every day | RESPIRATORY_TRACT | 11 refills | Status: DC

## 2022-05-26 NOTE — Progress Notes (Signed)
Subjective:    Patient ID: Alexandria Carter, female    DOB: 09-03-1945, 77 y.o.   MRN: [id number] Patient Care Team: Adin Hector, MD as PCP - General (Internal Medicine) Tyler Pita, MD as Consulting Physician (Pulmonary Disease)  Chief Complaint  Patient presents with   Follow-up    SOB with exertion. No wheezing. Dry cough, but it is better with the Trelegy.    HPI This is 77 year old lifelong never smoker with a history of cough spanning years.  She was last seen on 28 December 2021, this is a scheduled visit today.  PFTs performed 01/2020 were consistent with asthma.  She has been treated with Trelegy Ellipta 200 since and has noted resolution of her cough.  She has not had any dyspnea, no sputum production or hemoptysis.  During season change she takes Zyrtec at bedtime and notes that this helps with postnasal drip and with any potential recurrent cough.  She has not had any fevers, chills or sweats.  No chest pain, no orthopnea or paroxysmal nocturnal dyspnea.  No lower extremity edema, no calf tenderness.  She has not had any shortness of breath of late.  No wheezing.  Recall that she has had a history of breast cancer previously in 1994.  She was treated with with radiation therapy post lumpectomy.  She has had architectural distortion on the area of prior radiation.  And multiple small nodules.  Last CT chest was performed 29 May 2020 and a 18 to 36-month follow-up was recommended.  She is due for this now.  This was discussed with her.  She agrees to go ahead with this.  Today overall she feels well and looks well.    DATA: 02/04/2020 PFTs: FEV1 1.46 L or 69% predicted, FVC 2.36 L or 83% predicted, FEV1/FVC 62%.  Postbronchodilator 12% net change.  Lung volumes consistent with mild air trapping.  Small airways component with airways reversibility noted.  Diffusion capacity normal by Kco.  Consistent with asthma 02/19/2020 CT chest: No acute cardiopulmonary  abnormalities.  Chronic scarring and masslike architectural distortion involving anterior lateral left upper lobe sequela of prior radiation.  Scattered small lung nodules 6 mm or less in diameter repeat CT at 3 to 6 months recommended 05/29/2020 CT chest: Unchanged appearance of dense fibrotic scarring, architectural distortion and volume loss in the left pulmonary apex left upper lobe and lingula consistent with prior left breast radiation therapy.  Multiple tree-in-bud centrilobular nodules on the right consistent with atypical infection, other nodules throughout both lungs as on previous examination.  Bronchial wall thickening.  Repeat CT 18 to 24 months.  Review of Systems A 10 point review of systems was performed and it is as noted above otherwise negative.  Past Medical History:  Diagnosis Date   Asthma    Breast cancer 1994 and 1998   Bilateral   Cancer [id number]   Bil breast ca- chemo/radiation   Cancer    Kidney   Chronic kidney disease    History of kidney cancer    Personal history of chemotherapy 1994   F/U left breast cancer   Personal history of radiation therapy 1994   F/U left breast cancer   Personal history of radiation therapy 1998   F/U right breast cancer   Social History   Tobacco Use   Smoking status: Never   Smokeless tobacco: Never  Substance Use Topics   Alcohol use: Not Currently   No Known Allergies  Current Meds  Medication Sig   ascorbic acid (VITAMIN C) 500 MG tablet Take 500 mg by mouth daily.   aspirin 81 MG EC tablet Take 81 mg by mouth daily.   Calcium Carbonate-Vitamin D (CALCIUM-VITAMIN D) 600-125 MG-UNIT TABS Take 1 tablet by mouth daily.   cetirizine (ZYRTEC ALLERGY) 10 MG tablet Take 0.5 tablets (5 mg total) by mouth at bedtime.   latanoprost (XALATAN) 0.005 % ophthalmic solution nightly   lovastatin (MEVACOR) 40 MG tablet 80 mg daily.   montelukast (SINGULAIR) 10 MG tablet TAKE 1 TABLET BY MOUTH EVERY DAY   omeprazole (PRILOSEC) 40  MG capsule Take 1 capsule by mouth daily.   TRELEGY ELLIPTA 200-62.5-25 MCG/ACT AEPB INHALE 1 PUFF BY MOUTH EVERY DAY   triamcinolone (NASACORT) 55 MCG/ACT AERO nasal inhaler Place 1 spray into the nose in the morning and at bedtime.   Immunization History  Administered Date(s) Administered   Fluad Quad(high Dose 65+) 11/02/2018, 11/19/2019, 12/04/2021   Influenza Inj Mdck Quad Pf 11/16/2017   Influenza, High Dose Seasonal PF 12/14/2016, 12/16/2020   Influenza-Unspecified 11/23/2011, 12/25/2014, 11/10/2015   Moderna Covid-19 Vaccine Bivalent Booster 65yrs & up 12/02/2020   Moderna Sars-Covid-2 Vaccination 03/23/2019, 04/20/2019, 12/21/2019, 07/12/2020   Pfizer Covid-19 Vaccine Bivalent Booster 40yrs & up 12/02/2020   Pneumococcal Conjugate-13 03/21/2015   Pneumococcal Polysaccharide-23 10/08/2010   Tdap 02/22/2005   Zoster Recombinat (Shingrix) 09/20/2018, 11/27/2018   Zoster, Live 04/22/2008       Objective:   Physical Exam BP (!) 146/84 (BP Location: Left Arm, Cuff Size: Normal)   Pulse 82   Temp (!) 96.8 F (36 C)   Ht 5\' 4"  (1.626 m)   Wt 133 lb 3.2 oz (60.4 kg)   SpO2 97%   BMI 22.86 kg/m   SpO2: 97 % O2 Device: None (Room air)  GENERAL: Well-developed, well-nourished woman, no acute distress.  Slender build.  Fully ambulatory.  No throat clearing or coughing noted today. HEAD: Normocephalic, atraumatic.  EYES: Pupils equal, round, reactive to light.  No scleral icterus.  MOUTH: Dentition intact, oral mucosa moist. NECK: Supple. No thyromegaly. Trachea midline. No JVD.  No adenopathy. PULMONARY: Good air entry bilaterally.  No adventitious sounds. CARDIOVASCULAR: S1 and S2. Regular rate and rhythm.  No rubs, murmurs or gallops heard. ABDOMEN: Benign. MUSCULOSKELETAL: No joint deformity, no clubbing, no edema.  NEUROLOGIC: No focal deficits, no gait disturbance, speech is fluent. SKIN: Intact,warm,dry.  On limited exam, no rashes. PSYCH: Mood and behavior are  normal.  Lab Results  Component Value Date   NITRICOXIDE 12 05/26/2022  *No evidence of type II inflammation     Assessment & Plan:     ICD-10-CM   1. Moderate persistent asthma without complication  123456    Decrease Trelegy to 100 strength, 1 inhalation daily Continue as needed albuterol Nitric oxide without evidence of type II inflammation    2. Multiple lung nodules on CT  R91.8 CT CHEST WO CONTRAST   CT chest without contrast Patient due for follow-up    3. Chronic cough  R05.3 Nitric oxide   Resolved with asthma management     Orders Placed This Encounter  Procedures   CT CHEST WO CONTRAST    Standing Status:   Future    Standing Expiration Date:   05/26/2023    Order Specific Question:   Preferred imaging location?    Answer:   [facility]   Nitric oxide   Meds ordered this encounter  Medications   Fluticasone-Umeclidin-Vilant (TRELEGY ELLIPTA)  100-62.5-25 MCG/ACT AEPB    Sig: Inhale 1 puff into the lungs daily.    Dispense:  28 each    Refill:  11   Will see the patient in follow-up in 4 to 6 months time she is to contact us prior to that time should any new difficulties arise.   Renold Don, MD Advanced Bronchoscopy PCCM [facility]    *This note was dictated using voice recognition software/Dragon.  Despite best efforts to proofread, errors can occur which can change the meaning. Any transcriptional errors that result from this process are unintentional and may not be fully corrected at the time of dictation.

## 2022-05-26 NOTE — Patient Instructions (Signed)
Finish the current Trelegy you have.  We will decrease to the 100 dose on your next fill.  Let us know if you have any recurrence of cough or any issues on the lower dose.  We have scheduled a scan of the chest.  This is to follow-up on the findings that were seen previously.  If these are stable, as I anticipate they will be, you will not need further imaging after that.  I will see you in follow-up in 4 to 6 months time call sooner should any new problems arise.

## 2022-05-31 ENCOUNTER — Ambulatory Visit
Admission: RE | Admit: 2022-05-31 | Discharge: 2022-05-31 | Disposition: A | Discharge status: Home / Self Care | Payer: Medicare PPO | Source: Ambulatory Visit | Attending: Pulmonary Disease | Admitting: Pulmonary Disease

## 2022-05-31 DIAGNOSIS — R918 Other nonspecific abnormal finding of lung field: Secondary | ICD-10-CM | POA: Diagnosis not present

## 2022-06-07 ENCOUNTER — Other Ambulatory Visit: Payer: Self-pay

## 2022-06-07 DIAGNOSIS — R918 Other nonspecific abnormal finding of lung field: Secondary | ICD-10-CM

## 2022-08-11 ENCOUNTER — Other Ambulatory Visit: Payer: Self-pay | Admitting: Internal Medicine

## 2022-08-11 DIAGNOSIS — Z1231 Encounter for screening mammogram for malignant neoplasm of breast: Secondary | ICD-10-CM

## 2022-09-07 ENCOUNTER — Ambulatory Visit
Admission: RE | Admit: 2022-09-07 | Discharge: 2022-09-07 | Disposition: A | Discharge status: Home / Self Care | Payer: Medicare PPO | Source: Ambulatory Visit | Attending: Pulmonary Disease | Admitting: Pulmonary Disease

## 2022-09-07 DIAGNOSIS — R918 Other nonspecific abnormal finding of lung field: Secondary | ICD-10-CM | POA: Diagnosis present

## 2022-09-17 ENCOUNTER — Other Ambulatory Visit: Payer: Self-pay

## 2022-09-17 DIAGNOSIS — R918 Other nonspecific abnormal finding of lung field: Secondary | ICD-10-CM

## 2022-09-21 ENCOUNTER — Ambulatory Visit
Admission: RE | Admit: 2022-09-21 | Discharge: 2022-09-21 | Disposition: A | Discharge status: Home / Self Care | Payer: Medicare PPO | Source: Ambulatory Visit | Attending: Internal Medicine | Admitting: Internal Medicine

## 2022-09-21 DIAGNOSIS — Z1231 Encounter for screening mammogram for malignant neoplasm of breast: Secondary | ICD-10-CM | POA: Insufficient documentation

## 2022-11-22 ENCOUNTER — Encounter: Payer: Self-pay | Admitting: Pulmonary Disease

## 2022-11-22 ENCOUNTER — Ambulatory Visit: Payer: Medicare PPO | Admitting: Pulmonary Disease

## 2022-11-22 VITALS — BP 138/70 | HR 70 | Temp 98.0°F | Ht 64.0 in | Wt 131.0 lb

## 2022-11-22 DIAGNOSIS — R918 Other nonspecific abnormal finding of lung field: Secondary | ICD-10-CM | POA: Diagnosis not present

## 2022-11-22 DIAGNOSIS — J4540 Moderate persistent asthma, uncomplicated: Secondary | ICD-10-CM | POA: Diagnosis not present

## 2022-11-22 DIAGNOSIS — R053 Chronic cough: Secondary | ICD-10-CM

## 2022-11-22 DIAGNOSIS — Z853 Personal history of malignant neoplasm of breast: Secondary | ICD-10-CM | POA: Insufficient documentation

## 2022-11-22 LAB — NITRIC OXIDE: Nitric Oxide: 14

## 2022-11-22 MED ORDER — TRELEGY ELLIPTA 200-62.5-25 MCG/ACT IN AEPB: 1.0000 | INHALATION_SPRAY | Freq: Every day | RESPIRATORY_TRACT | 0 refills | Status: DC

## 2022-11-22 NOTE — Progress Notes (Signed)
Subjective:    Patient ID: Alexandria Carter, female    DOB: 1945-07-10, 77 y.o.   MRN: [id number]  Patient Care Team: Lynnea Ferrier, MD as PCP - General (Internal Medicine) Salena Saner, MD as Consulting Physician (Pulmonary Disease)  Chief Complaint  Patient presents with   Follow-up    Dry cough. No shortness of breath or wheezing.     HPI This is 77 year old lifelong never smoker with a history of cough spanning years.  She was last seen on 26 May 2022, this is a scheduled visit today.  PFTs performed 01/2020 were consistent with asthma.  She has been treated with Trelegy Ellipta 200 since and has noted resolution of her cough.  At her prior visit she had the strength of the Trelegy decreased.  Since then she has noted that her cough has returned and is not as controlled as when she was on the 200 strength.  She has not had any dyspnea, no sputum production or hemoptysis.  During season change she takes Zyrtec at bedtime and notes that this helps with postnasal drip and with any potential recurrent cough.  She has not had any fevers, chills or sweats.  No chest pain, no orthopnea or paroxysmal nocturnal dyspnea.  No lower extremity edema, no calf tenderness.  She has not had any shortness of breath of late.  No wheezing.   Recall that she has had a history of breast cancer previously in 1994.  She was treated with with radiation therapy post lumpectomy.  She has had architectural distortion on the area of prior radiation.  She has multiple small nodules.  Last CT chest was performed in July 2024 and this showed no new nodules.  The nodularity seen previously is likely related to areas of mild mucoid impaction within the terminal bronchioles.  I reviewed this film independently and this was discussed with her.    Today overall she feels well and looks well.    DATA: 02/04/2020 PFTs: FEV1 1.46 L or 69% predicted, FVC 2.36 L or 83% predicted, FEV1/FVC 62%.  Postbronchodilator 12%  net change.  Lung volumes consistent with mild air trapping.  Small airways component with airways reversibility noted.  Diffusion capacity normal by Kco.  Consistent with asthma 02/19/2020 CT chest: No acute cardiopulmonary abnormalities.  Chronic scarring and masslike architectural distortion involving anterior lateral left upper lobe sequela of prior radiation.  Scattered small lung nodules 6 mm or less in diameter repeat CT at 3 to 6 months recommended 05/29/2020 CT chest: Unchanged appearance of dense fibrotic scarring, architectural distortion and volume loss in the left pulmonary apex left upper lobe and lingula consistent with prior left breast radiation therapy.  Multiple tree-in-bud centrilobular nodules on the right consistent with atypical infection, other nodules throughout both lungs as on previous examination.  Bronchial wall thickening.  Repeat CT 18 to 24 months. 0/09/2022 CT chest: Interval development of small focus of tree-in-bud nodularity in the posterior right lower lobe, dominant nodule in this region measures 4 mm.  Imaging features likely reflect sequela of atypical infection, recommend follow-up CT chest in 3 months to ensure resolution.  Multiple scattered tiny bilateral pulmonary nodules stable in the interval. 09/07/2022 CT chest: Pulm nodules measuring 5 mm or less in size stable when compared to recent prior examination.  His other presumably benign.  Likely reflect areas of mild mucoid impaction with terminal bronchioles.  Aortic atherosclerosis, coronary calcifications and calcifications in the aortic valve.   Review  of Systems A 10 point review of systems was performed and it is as noted above otherwise negative.   Patient Active Problem List   Diagnosis Date Noted   History of breast cancer    Chronic cough 12/28/2021   Post-nasal drip 12/28/2021   Gallstones 01/30/2020   GERD (gastroesophageal reflux disease) 01/30/2020   Hemangioma 01/30/2020   Hyperlipidemia  01/30/2020   Moderate tricuspid regurgitation 12/07/2017   Osteopenia of multiple sites 11/27/2017   Aortic atherosclerosis (HCC) 05/10/2016   Stage 3a chronic kidney disease (HCC) 11/10/2015   History of kidney cancer 05/12/2015   H/O renal cell carcinoma 05/01/2015   Solitary kidney, acquired 07/12/2013   Glaucoma suspect 05/08/2013   Pseudophakia of both eyes 05/08/2013   Pseudoptosis 05/08/2013   Allergic rhinitis 05/20/2009   Asthma 04/03/2007    Social History   Tobacco Use   Smoking status: Never   Smokeless tobacco: Never  Substance Use Topics   Alcohol use: Not Currently    No Known Allergies  Current Meds  Medication Sig   ascorbic acid (VITAMIN C) 500 MG tablet Take 500 mg by mouth daily.   aspirin 81 MG EC tablet Take 81 mg by mouth daily.   Calcium Carbonate-Vitamin D (CALCIUM-VITAMIN D) 600-125 MG-UNIT TABS Take 1 tablet by mouth daily.   cetirizine (ZYRTEC ALLERGY) 10 MG tablet Take 0.5 tablets (5 mg total) by mouth at bedtime.   Fluticasone-Umeclidin-Vilant (TRELEGY ELLIPTA) 100-62.5-25 MCG/ACT AEPB Inhale 1 puff into the lungs daily.   latanoprost (XALATAN) 0.005 % ophthalmic solution nightly   montelukast (SINGULAIR) 10 MG tablet TAKE 1 TABLET BY MOUTH EVERY DAY   omeprazole (PRILOSEC) 40 MG capsule Take 1 capsule by mouth daily.   rosuvastatin (CRESTOR) 10 MG tablet Take 10 mg by mouth daily.   triamcinolone (NASACORT) 55 MCG/ACT AERO nasal inhaler Place 1 spray into the nose in the morning and at bedtime.    Immunization History  Administered Date(s) Administered   Fluad Quad(high Dose 65+) 11/02/2018, 11/19/2019, 12/04/2021   Influenza Inj Mdck Quad Pf 11/16/2017   Influenza, High Dose Seasonal PF 12/14/2016, 12/16/2020   Influenza-Unspecified 11/23/2011, 12/25/2014, 11/10/2015   Moderna Covid-19 Vaccine Bivalent Booster 78yrs & up 12/02/2020   Moderna Sars-Covid-2 Vaccination 03/23/2019, 04/20/2019, 12/21/2019, 07/12/2020   Pfizer Covid-19 Vaccine  Bivalent Booster 5yrs & up 12/02/2020, 06/23/2021   Pfizer(Comirnaty)Fall Seasonal Vaccine 12 years and older 11/25/2021   Pneumococcal Conjugate-13 03/21/2015   Pneumococcal Polysaccharide-23 10/08/2010   Tdap 02/22/2005   Zoster Recombinant(Shingrix) 09/20/2018, 11/27/2018   Zoster, Live 04/22/2008        Objective:   BP 138/70 (BP Location: Right Arm, Patient Position: Sitting, Cuff Size: Normal)   Pulse 70   Temp 98 F (36.7 C) (Temporal)   Ht 5\' 4"  (1.626 m)   Wt 131 lb (59.4 kg)   SpO2 97%   BMI 22.49 kg/m   SpO2: 97 %  GENERAL: Well-developed, well-nourished woman, no acute distress.  Slender build.  Fully ambulatory.  No throat clearing or coughing noted today. HEAD: Normocephalic, atraumatic.  EYES: Pupils equal, round, reactive to light.  No scleral icterus.  MOUTH: Dentition intact, oral mucosa moist. NECK: Supple. No thyromegaly. Trachea midline. No JVD.  No adenopathy. PULMONARY: Good air entry bilaterally.  No adventitious sounds. CARDIOVASCULAR: S1 and S2. Regular rate and rhythm.  No rubs, murmurs or gallops heard. ABDOMEN: Benign. MUSCULOSKELETAL: No joint deformity, no clubbing, no edema.  NEUROLOGIC: No focal deficits, no gait disturbance, speech is fluent. SKIN: Intact,warm,dry.  On limited exam, no rashes. PSYCH: Mood and behavior are normal.  Lab Results  Component Value Date   NITRICOXIDE 14 11/22/2022   CT chest from 07 September 2022 independently reviewed.  Findings discussed with the patient.      Assessment & Plan:     ICD-10-CM   1. Moderate persistent asthma without complication  J45.40 Pulmonary Function Test [facility] Only   Evidence of mucoid impaction and airways Nitric oxide with no evidence of type II inflammation Cough variant likely Increase Trelegy to 200 PRN albuterol    2. Chronic cough  R05.3 Nitric oxide    Pulmonary Function Test [facility] Only   Cough variant asthma likely Increase Trelegy to 200 Previously this helped cough     3. Multiple lung nodules on CT  R91.8    These appear to be more mucoid impaction No new nodules noted      Orders Placed This Encounter  Procedures   Nitric oxide   Pulmonary Function Test [facility] Only    Standing Status:   Future    Standing Expiration Date:   11/22/2023    Order Specific Question:   Full PFT: includes the following: basic spirometry, spirometry pre & post bronchodilator, diffusion capacity (DLCO), lung volumes    Answer:   Full PFT    Order Specific Question:   This test can only be performed at    Answer:   [facility]    Meds ordered this encounter  Medications   Fluticasone-Umeclidin-Vilant (TRELEGY ELLIPTA) 200-62.5-25 MCG/ACT AEPB    Sig: Inhale 1 puff into the lungs daily.    Dispense:  28 each    Refill:  0    Order Specific Question:   Lot Number?    Answer:   [id number]    Order Specific Question:   Expiration Date?    Answer:   01/23/2024    Order Specific Question:   Quantity    Answer:   2   We will see the patient in follow-up in 3 to 4 months time she is to call sooner should any new problems arise.   Gailen Shelter, MD Advanced Bronchoscopy PCCM [facility]    *This note was dictated using voice recognition software/Dragon.  Despite best efforts to proofread, errors can occur which can change the meaning. Any transcriptional errors that result from this process are unintentional and may not be fully corrected at the time of dictation.

## 2022-11-22 NOTE — Patient Instructions (Signed)
We are giving you a trial of the 200 mcg size of Trelegy.  This is 1 puff daily.  Rinse your mouth well after you use it.  Do not use the Trelegy 100 while you are on the 200 mcg size.  Let us know which strength you feel does better for you.  This way we can send the appropriate prescription to your pharmacy.  We are going to repeat breathing tests.  We will see you in follow-up in 3 to 4 months time call sooner should any new problems arise.

## 2022-12-06 ENCOUNTER — Encounter: Payer: Self-pay | Admitting: Pulmonary Disease

## 2023-01-09 IMAGING — MG MM DIGITAL SCREENING BILAT W/ TOMO AND CAD
8 series · 9 of 24 positions shown · non-contrast
Comparison: Previous exam(s).

CLINICAL DATA: Screening.

EXAM:
DIGITAL SCREENING BILATERAL MAMMOGRAM WITH TOMOSYNTHESIS AND CAD
TECHNIQUE: Bilateral screening digital craniocaudal and mediolateral oblique
mammograms were obtained. Bilateral screening digital breast
tomosynthesis was performed. The images were evaluated with
computer-aided detection.

[R CC synth-2D]
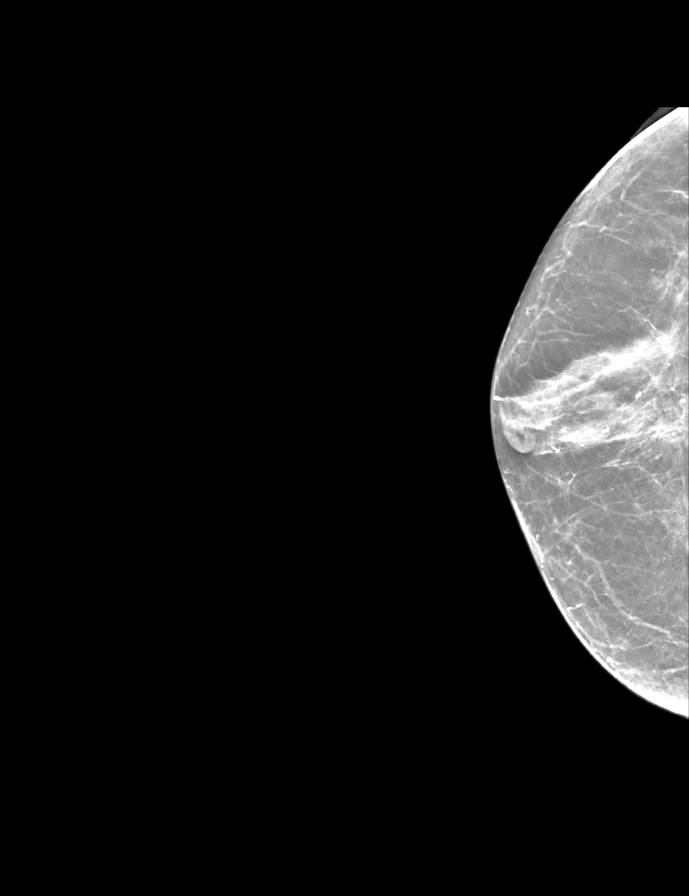

[L MLO synth-2D]
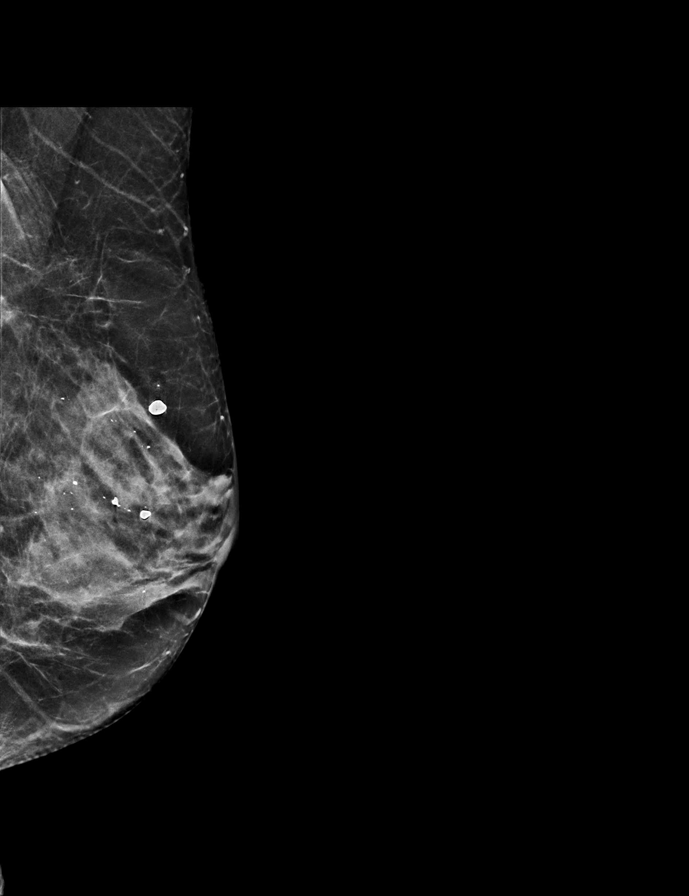

[R MLO synth-2D]
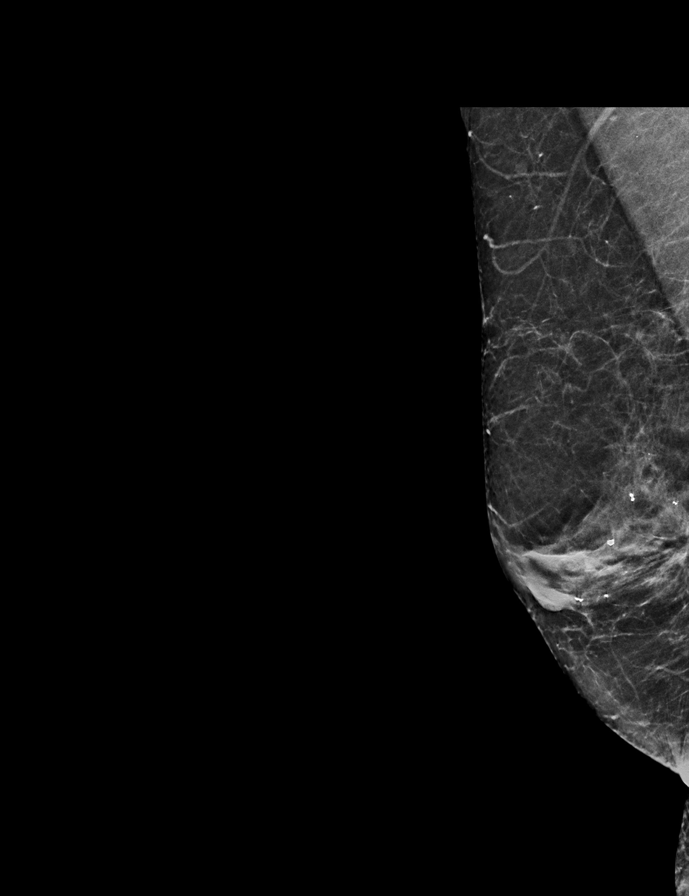

[L CC synth-2D]
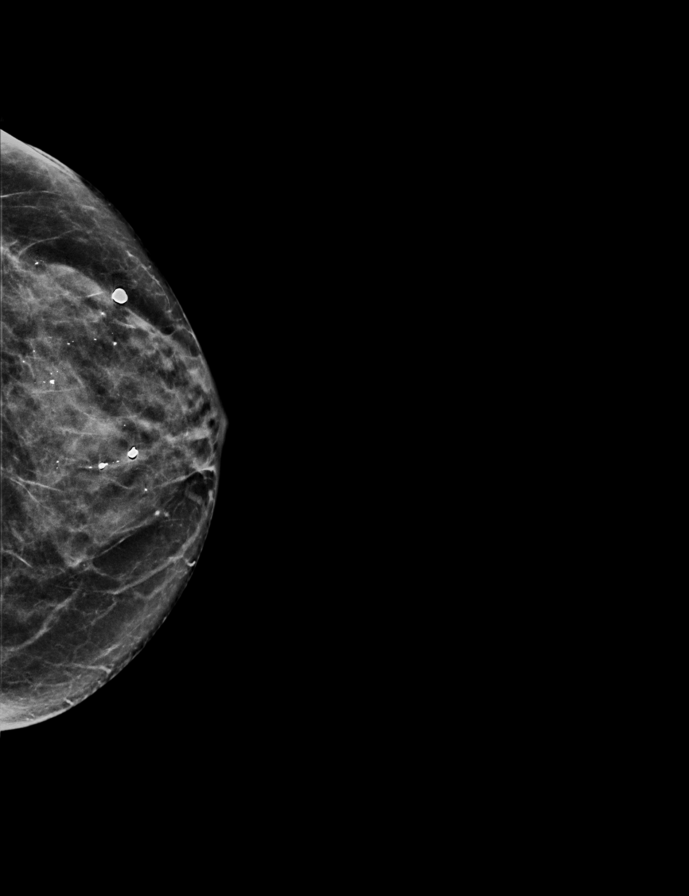

[L CC tomo · 2 of 52 frames shown]
[frame 17/52]
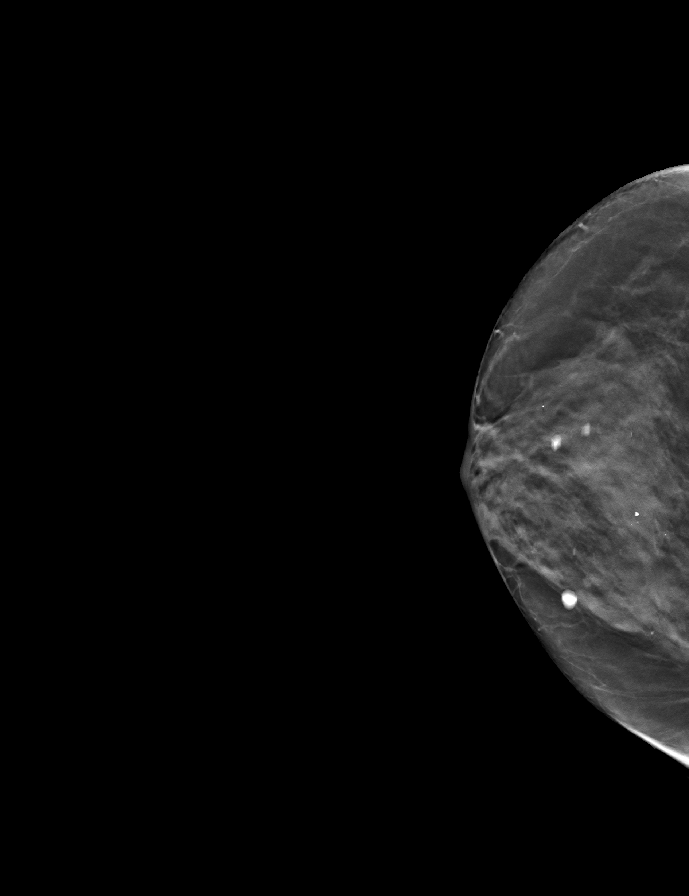
[frame 27/52]
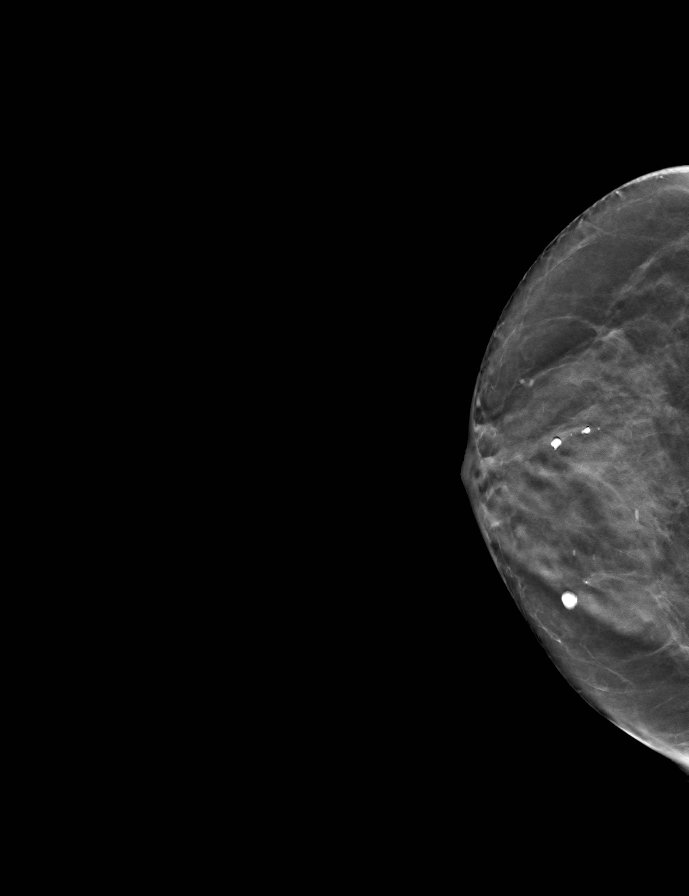

[L MLO tomo · tomo slice 27/52.0]
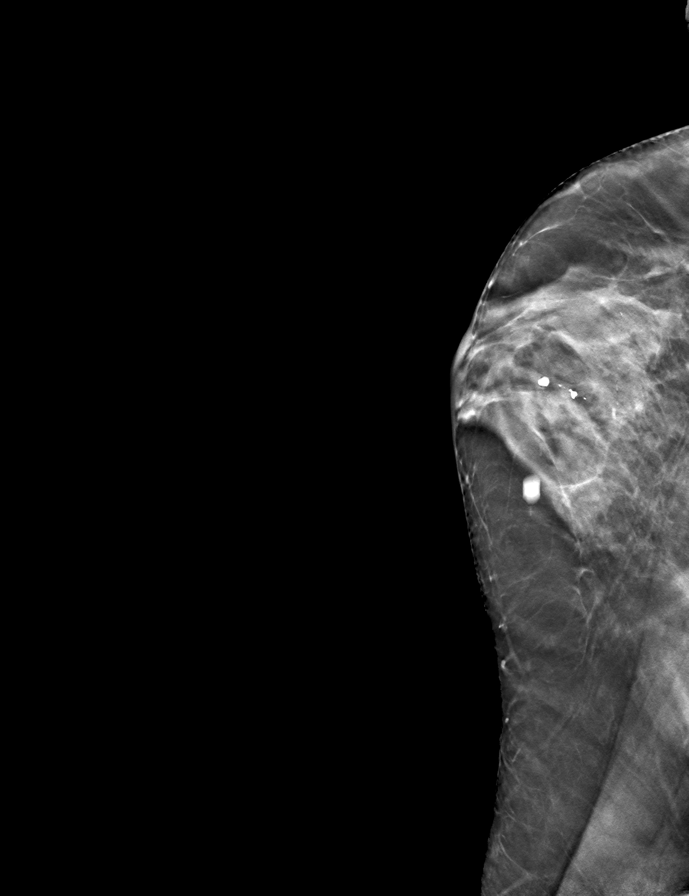

[R CC tomo · tomo slice 27/52.0]
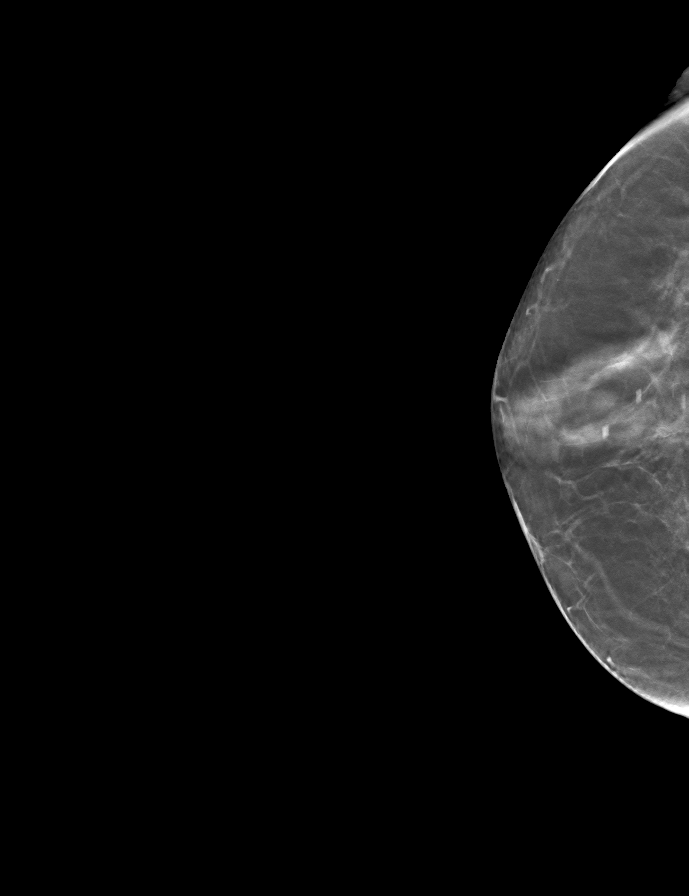

[R MLO tomo · tomo slice 25/49.0]
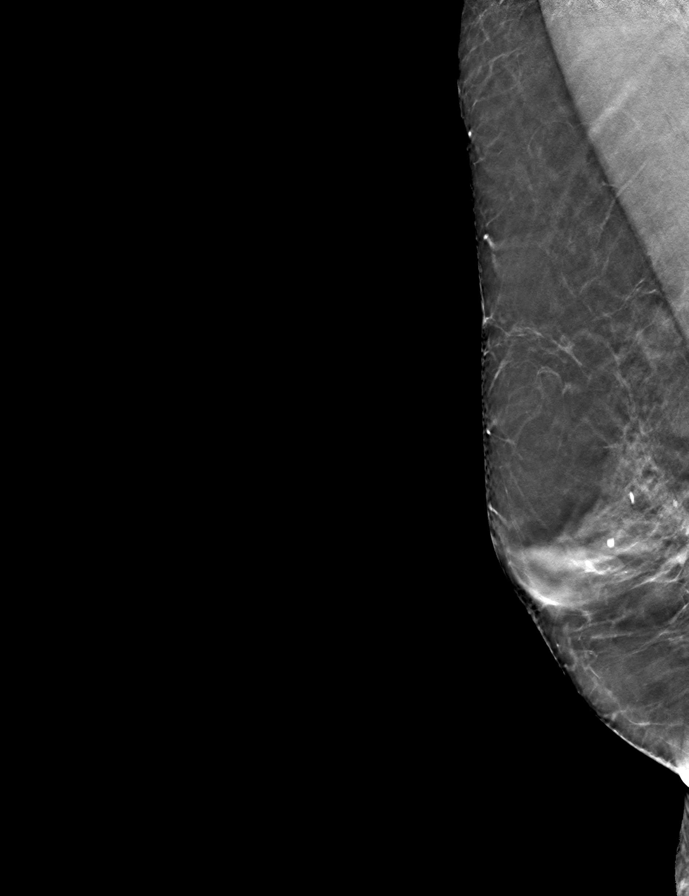

[9 of 24 positions shown; findings below may reference images not displayed]

ACR Breast Density Category c: The breast tissue is heterogeneously
dense, which may obscure small masses.
FINDINGS: There are no findings suspicious for malignancy.
IMPRESSION: No mammographic evidence of malignancy. A result letter of this
screening mammogram will be mailed directly to the patient.

RECOMMENDATION:
Screening mammogram in one year. (Code:[id number])

BI-RADS CATEGORY  1: Negative.

## 2023-04-25 ENCOUNTER — Other Ambulatory Visit: Payer: Self-pay | Admitting: Pulmonary Disease

## 2023-06-14 ENCOUNTER — Other Ambulatory Visit: Payer: Self-pay | Admitting: Pulmonary Disease

## 2023-06-14 NOTE — Telephone Encounter (Signed)
 Pt last seen in September 2024, was supposed to have a 3-4 month return follow up appointment. Never returned -- pt needs to schedule follow up.

## 2023-06-15 ENCOUNTER — Encounter: Payer: Self-pay | Admitting: Pulmonary Disease

## 2023-06-15 MED ORDER — TRELEGY ELLIPTA 100-62.5-25 MCG/ACT IN AEPB: 1.0000 | INHALATION_SPRAY | Freq: Every day | RESPIRATORY_TRACT | 11 refills | Status: AC

## 2023-07-22 ENCOUNTER — Other Ambulatory Visit: Payer: Self-pay | Admitting: Pulmonary Disease

## 2023-08-31 ENCOUNTER — Other Ambulatory Visit: Payer: Self-pay | Admitting: Internal Medicine

## 2023-08-31 DIAGNOSIS — Z1231 Encounter for screening mammogram for malignant neoplasm of breast: Secondary | ICD-10-CM

## 2023-09-06 ENCOUNTER — Telehealth: Payer: Self-pay | Admitting: Pulmonary Disease

## 2023-09-06 NOTE — Telephone Encounter (Signed)
 Patient needs PFT scheduled today at 1:00 pm.

## 2023-09-07 ENCOUNTER — Encounter: Payer: Self-pay | Admitting: Pulmonary Disease

## 2023-09-07 ENCOUNTER — Ambulatory Visit: Admitting: Pulmonary Disease

## 2023-09-07 VITALS — BP 138/86 | HR 85 | Temp 98.2°F | Ht 64.0 in | Wt 129.0 lb

## 2023-09-07 DIAGNOSIS — R918 Other nonspecific abnormal finding of lung field: Secondary | ICD-10-CM

## 2023-09-07 DIAGNOSIS — Z85528 Personal history of other malignant neoplasm of kidney: Secondary | ICD-10-CM | POA: Diagnosis not present

## 2023-09-07 DIAGNOSIS — R053 Chronic cough: Secondary | ICD-10-CM

## 2023-09-07 DIAGNOSIS — Z853 Personal history of malignant neoplasm of breast: Secondary | ICD-10-CM

## 2023-09-07 DIAGNOSIS — J4540 Moderate persistent asthma, uncomplicated: Secondary | ICD-10-CM | POA: Diagnosis not present

## 2023-09-07 NOTE — Patient Instructions (Addendum)
 VISIT SUMMARY:  Today, you came in for a follow-up visit to discuss your chronic cough, asthma, and multiple lung nodules. Your chronic cough is well controlled, and your asthma is being effectively managed with your current medication. We also discussed the need for a follow-up scan for your lung nodules.  YOUR PLAN:  -ASTHMA: Asthma is a condition where your airways narrow and swell, which can make breathing difficult. Your asthma is well-controlled with your current medication, Trelegy. Please continue taking Trelegy as prescribed.  -CHRONIC COUGH: Chronic cough is a cough that lasts for a long time. Your chronic cough is well-controlled, with only occasional episodes. You have some post-nasal drip, but you stopped using the nasal spray because it wasn't helping much.  -MULTIPLE LUNG NODULES: Multiple lung nodules are small growths in the lungs. Your lung nodules have not changed since your last scan in July of the previous year. We will order a follow-up scan to monitor them and schedule a follow-up appointment in 6 months.  INSTRUCTIONS:  Please schedule a follow-up scan for your lung nodules and make an appointment to see us  again in 6 months.

## 2023-09-07 NOTE — Progress Notes (Signed)
 Subjective:    Patient ID: Alexandria Carter, female    DOB: 1946-01-07, 77 y.o.   MRN: [id number]  Patient Care Team: Fernande Ophelia JINNY DOUGLAS, MD as PCP - General (Internal Medicine) Tamea Dedra CROME, MD as Consulting Physician (Pulmonary Disease)  Chief Complaint  Patient presents with   Follow-up    BACKGROUND/INTERVAL: Alexandria Carter is a 78 year old lifelong never smoker who has issues with chronic cough, moderate persistent asthma and multiple lung nodules in the setting of prior history of renal cell carcinoma and prior breast cancer.  She was last seen here on 22 November 2022.   HPI History of Present Illness Alexandria Carter is a 78 year old female with chronic cough, asthma, and multiple lung nodules who presents for follow-up.  Her chronic cough is well controlled, occurring only occasionally. She experiences some post-nasal drip, which she does not find significant. She had previously used a nasal spray but discontinued it due to minimal effect on her symptoms.  Her asthma is managed with Trelegy, and she is doing well with this medication.  Her last imaging for multiple lung nodules was in July of the previous year. She is due for another scan soon.  If her lung nodules remain stable she will not need any further imaging.  DATA: 02/04/2020 PFTs: FEV1 1.46 L or 69% predicted, FVC 2.36 L or 83% predicted, FEV1/FVC 62%.  Postbronchodilator 12% net change.  Lung volumes consistent with mild air trapping.  Small airways component with airways reversibility noted.  Diffusion capacity normal by [facility].  Consistent with asthma 02/19/2020 CT chest: No acute cardiopulmonary abnormalities.  Chronic scarring and masslike architectural distortion involving anterior lateral left upper lobe sequela of prior radiation.  Scattered small lung nodules 6 mm or less in diameter repeat CT at 3 to 6 months recommended 05/29/2020 CT chest: Unchanged appearance of dense fibrotic scarring, architectural  distortion and volume loss in the left pulmonary apex left upper lobe and lingula consistent with prior left breast radiation therapy.  Multiple tree-in-bud centrilobular nodules on the right consistent with atypical infection, other nodules throughout both lungs as on previous examination.  Bronchial wall thickening.  Repeat CT 18 to 24 months. 0/09/2022 CT chest: Interval development of small focus of tree-in-bud nodularity in the posterior right lower lobe, dominant nodule in this region measures 4 mm.  Imaging features likely reflect sequela of atypical infection, recommend follow-up CT chest in 3 months to ensure resolution.  Multiple scattered tiny bilateral pulmonary nodules stable in the interval. 09/07/2022 CT chest: Pulm nodules measuring 5 mm or less in size stable when compared to recent prior examination.  His other presumably benign.  Likely reflect areas of mild mucoid impaction with terminal bronchioles.  Aortic atherosclerosis, coronary calcifications and calcifications in the aortic valve.    Review of Systems A 10 point review of systems was performed and it is as noted above otherwise negative.   Patient Active Problem List   Diagnosis Date Noted   History of breast cancer    Chronic cough 12/28/2021   Post-nasal drip 12/28/2021   Gallstones 01/30/2020   GERD (gastroesophageal reflux disease) 01/30/2020   Hemangioma 01/30/2020   Hyperlipidemia 01/30/2020   Moderate tricuspid regurgitation 12/07/2017   Osteopenia of multiple sites 11/27/2017   Aortic atherosclerosis (HCC) 05/10/2016   Stage 3a chronic kidney disease (HCC) 11/10/2015   History of kidney cancer 05/12/2015   H/O renal cell carcinoma 05/01/2015   Solitary kidney, acquired 07/12/2013   Glaucoma suspect  05/08/2013   Pseudophakia of both eyes 05/08/2013   Pseudoptosis 05/08/2013   Allergic rhinitis 05/20/2009   Asthma 04/03/2007    Social History   Tobacco Use   Smoking status: Never   Smokeless  tobacco: Never  Substance Use Topics   Alcohol use: Not Currently    No Known Allergies  Current Meds  Medication Sig   ascorbic acid (VITAMIN C) 500 MG tablet Take 500 mg by mouth daily.   aspirin 81 MG EC tablet Take 81 mg by mouth daily.   Calcium Carbonate-Vitamin D (CALCIUM-VITAMIN D) 600-125 MG-UNIT TABS Take 1 tablet by mouth daily.   Fluticasone-Umeclidin-Vilant (TRELEGY ELLIPTA ) 100-62.5-25 MCG/ACT AEPB Inhale 1 puff into the lungs daily.   latanoprost (XALATAN) 0.005 % ophthalmic solution nightly   montelukast  (SINGULAIR ) 10 MG tablet TAKE 1 TABLET (10 MG TOTAL) BY MOUTH DAILY. PLEASE SCHEDULE OFFICE VISIT BEFORE ANY FUTURE REFILLS.   omeprazole (PRILOSEC) 40 MG capsule Take 1 capsule by mouth daily.   rosuvastatin (CRESTOR) 10 MG tablet Take 10 mg by mouth daily.    Immunization History  Administered Date(s) Administered   Fluad Quad(high Dose 65+) 11/02/2018, 11/19/2019, 12/04/2021   Influenza Inj Mdck Quad Pf 11/16/2017   Influenza, High Dose Seasonal PF 12/14/2016, 12/16/2020   Influenza, Mdck, Trivalent,PF 6+ MOS(egg free) 12/22/2022   Influenza-Unspecified 11/23/2011, 12/25/2014, 11/10/2015   Moderna Covid-19 Vaccine Bivalent Booster 8yrs & up 12/02/2020   Moderna Sars-Covid-2 Vaccination 03/23/2019, 04/20/2019, 12/21/2019, 07/12/2020   Pfizer Covid-19 Vaccine Bivalent Booster 80yrs & up 12/02/2020, 06/23/2021   Pfizer(Comirnaty)Fall Seasonal Vaccine 12 years and older 11/25/2021   Pneumococcal Conjugate-13 03/21/2015   Pneumococcal Polysaccharide-23 10/08/2010   Rsv, Bivalent, Protein Subunit Rsvpref,pf [place]) 01/21/2023   Tdap 02/22/2005   Zoster Recombinant(Shingrix) 09/20/2018, 11/27/2018   Zoster, Live 04/22/2008        Objective:     BP 138/86 (BP Location: Right Arm, Patient Position: Sitting, Cuff Size: Normal)   Pulse 85   Temp 98.2 F (36.8 C) (Oral)   Ht 5' 4 (1.626 m)   Wt 129 lb (58.5 kg)   SpO2 97%   BMI 22.14 kg/m   SpO2: 97  %  GENERAL: Well-developed, well-nourished woman, no acute distress.  Slender build.  Fully ambulatory.  No throat clearing or coughing noted today. HEAD: Normocephalic, atraumatic.  EYES: Pupils equal, round, reactive to light.  No scleral icterus.  MOUTH: Dentition intact, oral mucosa moist. NECK: Supple. No thyromegaly. Trachea midline. No JVD.  No adenopathy. PULMONARY: Good air entry bilaterally.  No adventitious sounds. CARDIOVASCULAR: S1 and S2. Regular rate and rhythm.  No rubs, murmurs or gallops heard. ABDOMEN: Benign. MUSCULOSKELETAL: No joint deformity, no clubbing, no edema.  NEUROLOGIC: No focal deficits, no gait disturbance, speech is fluent. SKIN: Intact,warm,dry.  On limited exam, no rashes. PSYCH: Mood and behavior are normal.   Assessment & Plan:     ICD-10-CM   1. Moderate persistent asthma without complication  J45.40     2. Chronic cough  R05.3     3. Multiple lung nodules on CT  R91.8      Discussion:  Moderate persistent asthma Asthma is well-controlled with current treatment. Cough is well-managed and occurs infrequently. - Continue Trelegy. - Continue as needed albuterol .  Chronic cough Chronic cough is well-controlled, with occasional episodes. Residual post-nasal drip persists, but nasal spray was discontinued due to lack of efficacy.  Multiple lung nodules Multiple lung nodules have shown no change since the last scan in July of the  previous year. - Order follow-up scan for lung nodules. - Schedule follow-up appointment in 6 months.  Advised if symptoms do not improve or worsen, to please contact office for sooner follow up or seek emergency care.    I spent 32 minutes of dedicated to the care of this patient on the date of this encounter to include pre-visit review of records, face-to-face time with the patient discussing conditions above, post visit ordering of testing, clinical documentation with the electronic health record, making  appropriate referrals as documented, and communicating necessary findings to members of the patients care team.     C. Leita Sanders, MD Advanced Bronchoscopy PCCM [facility]    *This note was generated using voice recognition software/Dragon and/or AI transcription program.  Despite best efforts to proofread, errors can occur which can change the meaning. Any transcriptional errors that result from this process are unintentional and may not be fully corrected at the time of dictation.

## 2023-09-13 ENCOUNTER — Ambulatory Visit
Admission: RE | Admit: 2023-09-13 | Discharge: 2023-09-13 | Disposition: A | Discharge status: Home / Self Care | Source: Ambulatory Visit | Attending: Pulmonary Disease | Admitting: Pulmonary Disease

## 2023-09-13 DIAGNOSIS — R918 Other nonspecific abnormal finding of lung field: Secondary | ICD-10-CM | POA: Insufficient documentation

## 2023-09-19 ENCOUNTER — Ambulatory Visit: Payer: Self-pay | Admitting: Pulmonary Disease

## 2023-09-28 ENCOUNTER — Ambulatory Visit
Admission: RE | Admit: 2023-09-28 | Discharge: 2023-09-28 | Disposition: A | Discharge status: Home / Self Care | Source: Ambulatory Visit | Attending: Internal Medicine | Admitting: Internal Medicine

## 2023-09-28 DIAGNOSIS — Z1231 Encounter for screening mammogram for malignant neoplasm of breast: Secondary | ICD-10-CM | POA: Insufficient documentation

## 2023-10-16 ENCOUNTER — Other Ambulatory Visit: Payer: Self-pay | Admitting: Pulmonary Disease
